# Patient Record
Sex: Female | Born: 1935 | Race: Black or African American | Hispanic: No | Smoking: Never smoker
Health system: Southern US, Community
[De-identification: ages and names within clinical notes are randomized; demographics above are authoritative.]

## PROBLEM LIST (undated history)

## (undated) DIAGNOSIS — K219 Gastro-esophageal reflux disease without esophagitis: Secondary | ICD-10-CM

## (undated) DIAGNOSIS — I639 Cerebral infarction, unspecified: Secondary | ICD-10-CM

## (undated) DIAGNOSIS — E785 Hyperlipidemia, unspecified: Secondary | ICD-10-CM

## (undated) DIAGNOSIS — G309 Alzheimer's disease, unspecified: Secondary | ICD-10-CM

## (undated) DIAGNOSIS — N19 Unspecified kidney failure: Secondary | ICD-10-CM

## (undated) DIAGNOSIS — F0280 Dementia in other diseases classified elsewhere without behavioral disturbance: Secondary | ICD-10-CM

## (undated) DIAGNOSIS — F32A Depression, unspecified: Secondary | ICD-10-CM

## (undated) DIAGNOSIS — F329 Major depressive disorder, single episode, unspecified: Secondary | ICD-10-CM

## (undated) DIAGNOSIS — F0390 Unspecified dementia without behavioral disturbance: Secondary | ICD-10-CM

## (undated) DIAGNOSIS — I10 Essential (primary) hypertension: Secondary | ICD-10-CM

## (undated) HISTORY — PX: LAMINECTOMY: SHX219

## (undated) HISTORY — DX: Unspecified kidney failure: N19

## (undated) HISTORY — DX: Essential (primary) hypertension: I10

## (undated) HISTORY — DX: Hyperlipidemia, unspecified: E78.5

## (undated) HISTORY — DX: Major depressive disorder, single episode, unspecified: F32.9

## (undated) HISTORY — PX: CATARACT EXTRACTION W/ INTRAOCULAR LENS IMPLANT: SHX1309

## (undated) HISTORY — PX: CHOLECYSTECTOMY: SHX55

## (undated) HISTORY — DX: Gastro-esophageal reflux disease without esophagitis: K21.9

## (undated) HISTORY — PX: TOTAL ABDOMINAL HYSTERECTOMY: SHX209

## (undated) HISTORY — DX: Unspecified dementia, unspecified severity, without behavioral disturbance, psychotic disturbance, mood disturbance, and anxiety: F03.90

## (undated) HISTORY — DX: Cerebral infarction, unspecified: I63.9

## (undated) HISTORY — DX: Depression, unspecified: F32.A

---

## 2010-07-04 ENCOUNTER — Encounter: Payer: Self-pay | Admitting: Family Medicine

## 2010-07-17 ENCOUNTER — Encounter: Payer: Self-pay | Admitting: Family Medicine

## 2010-08-15 ENCOUNTER — Encounter: Payer: Self-pay | Admitting: Family Medicine

## 2010-08-20 ENCOUNTER — Inpatient Hospital Stay: Payer: Self-pay | Admitting: Specialist

## 2010-09-19 ENCOUNTER — Inpatient Hospital Stay: Payer: Self-pay | Admitting: Internal Medicine

## 2010-12-03 ENCOUNTER — Ambulatory Visit
Admission: RE | Admit: 2010-12-03 | Discharge: 2010-12-03 | Disposition: A | Discharge status: Home / Self Care | Payer: Medicare Other | Source: Ambulatory Visit | Attending: Internal Medicine | Admitting: Internal Medicine

## 2010-12-03 ENCOUNTER — Other Ambulatory Visit: Payer: Self-pay | Admitting: Internal Medicine

## 2010-12-03 DIAGNOSIS — R062 Wheezing: Secondary | ICD-10-CM

## 2010-12-03 DIAGNOSIS — R140 Abdominal distension (gaseous): Secondary | ICD-10-CM

## 2011-01-28 ENCOUNTER — Other Ambulatory Visit: Payer: Self-pay | Admitting: Internal Medicine

## 2011-01-28 DIAGNOSIS — I739 Peripheral vascular disease, unspecified: Secondary | ICD-10-CM

## 2011-02-04 ENCOUNTER — Encounter: Payer: Self-pay | Admitting: Internal Medicine

## 2011-02-04 ENCOUNTER — Ambulatory Visit (INDEPENDENT_AMBULATORY_CARE_PROVIDER_SITE_OTHER): Payer: Medicare Other | Admitting: Internal Medicine

## 2011-02-04 VITALS — BP 132/76 | HR 63 | Ht 66.0 in | Wt 177.4 lb

## 2011-02-04 DIAGNOSIS — J45909 Unspecified asthma, uncomplicated: Secondary | ICD-10-CM

## 2011-02-04 MED ORDER — ALBUTEROL SULFATE (2.5 MG/3ML) 0.083% IN NEBU: 2.5000 mg | INHALATION_SOLUTION | Freq: Four times a day (QID) | RESPIRATORY_TRACT | Status: DC | PRN

## 2011-02-04 MED ORDER — ALBUTEROL SULFATE (2.5 MG/3ML) 0.083% IN NEBU
2.5000 mg | INHALATION_SOLUTION | Freq: Once | RESPIRATORY_TRACT | Status: AC
  Administered 2011-02-04: 2.5 mg via RESPIRATORY_TRACT

## 2011-02-04 NOTE — Assessment & Plan Note (Addendum)
Wheeze suggests that there may a reversible asthma component. She is unable to coordinate metered inhalers, but may do well with a nebulizer for trial. We have discussed postnasal drip and reflux to watch for. I have encouraged elevation of the head of the bed. I don't believe she could perform a pulmonary function test reliably although we may try simple spirometry later. BP is high and there may be interstitial edema which would justify addition of a diuretic back into her BP management depending on the stability of her renal function.Marland Kitchen

## 2011-02-04 NOTE — Progress Notes (Signed)
Subjective:    Patient ID: Angela Cervantes, female    DOB: 1921-11-28, 77 y.o.   MRN: [id number]  HPI 02/04/11- 77 year old female never smoker referred courtesy of Dr. Allena Katz because of wheezing dyspnea over the past 6 months. She comes with her son Angela Cervantes and his wife. Her son actually does much of the explaining and history. They describe wheeze. It seems to wake her at times, but also occurs during the daytime with poorly defined pattern. She is worse with exertion and relieved by rest. She does not notice reflux. They deny that she chokes while eating or drinking. They deny history of heart disease or heart failure but say she was treated for "fluid" in March and April. A diuretic helped that problem but may not have affected the current complaint. Metered inhalers did not seem to work because she could not use them reliably. The family indicates active medical problems of hypertension, depression, dementia with history of stroke, GERD, allergic rhinitis. Chest x-ray 09/19/2010 showed no active disease. Chest x-ray 11/07/2010 indicated right lower lobe infiltrate. Chest x-ray 11/23/2010 indicated moderate chronic bronchitis with linear density in the right upper lobe.  Review of Systems Constitutional:   No-   weight loss, night sweats, fevers, chills, fatigue, lassitude. HEENT:   No-  headaches, difficulty swallowing, tooth/dental problems, sore throat,       No-  sneezing, itching, ear ache, nasal congestion, post nasal drip,  CV:  No-   chest pain, orthopnea, PND, swelling in lower extremities, anasarca, dizziness, palpitations Resp: +   shortness of breath with exertion, not at rest.              No-   productive cough,  No non-productive cough,  No-  coughing up of blood.              No-   change in color of mucus.  + wheezing.   Skin: No-   rash or lesions. GI:  No-   heartburn, indigestion, abdominal pain, nausea, vomiting, diarrhea,                 change in bowel habits, loss of  appetite GU: No-   dysuria, change in color of urine, no urgency or frequency.  No- flank pain. MS:  No-   joint pain or swelling.  No- decreased range of motion.  No- back pain. Neuro- grossly normal to observation, Or:  Psych:  No- change in mood or affect. No depression or anxiety. +memory loss.      Objective:   Physical Exam General- Alert, Oriented, Affect-appropriate, Distress- none acute, passive but responsive to questions, wheelchair Skin- rash-none, lesions- none, excoriation- none Lymphadenopathy- none Head- atraumatic            Eyes- Gross vision intact, PERRLA, conjunctivae clear secretions            Ears- Hearing-seems intact            Nose- Clear, no-Septal dev, mucus, polyps, erosion, perforation             Throat- Mallampati III-IV , mucosa clear- not red , drainage- none, tonsils- atrophic Neck- flexible , trachea midline, no stridor , thyroid nl, carotid no bruit Chest - symmetrical excursion , unlabored           Heart/CV- RRR , no murmur , no gallop  , no rub, nl s1 s2                           -  JVD- none , edema- 1+ with TED hose, stasis changes- none, varices- none           Lung- slight upper airway rattle, wheeze- none, cough- none , dullness-none, rub- none           Chest wall-  Abd- tender-no, distended-no, bowel sounds-present, HSM- no Br/ Gen/ Rectal- Not done, not indicated Extrem- cyanosis- none, clubbing, none, atrophy- none, strength- nl Neuro- grossly intact to observation         Assessment & Plan:

## 2011-02-04 NOTE — Patient Instructions (Addendum)
Try elevating the head of her bed about the height of a brick under each head leg.  Script for [facility]- DME  Nebulizer and albuterol  Nebulizer albuterol here

## 2011-02-05 ENCOUNTER — Ambulatory Visit
Admission: RE | Admit: 2011-02-05 | Discharge: 2011-02-05 | Disposition: A | Discharge status: Home / Self Care | Payer: Medicare Other | Source: Ambulatory Visit | Attending: Internal Medicine | Admitting: Internal Medicine

## 2011-02-05 DIAGNOSIS — I739 Peripheral vascular disease, unspecified: Secondary | ICD-10-CM

## 2011-03-25 ENCOUNTER — Ambulatory Visit: Payer: Medicare Other | Admitting: Internal Medicine

## 2011-03-28 ENCOUNTER — Inpatient Hospital Stay: Payer: Self-pay | Admitting: Internal Medicine

## 2011-05-29 ENCOUNTER — Ambulatory Visit: Payer: Self-pay | Admitting: Otolaryngology

## 2011-07-16 ENCOUNTER — Other Ambulatory Visit (HOSPITAL_COMMUNITY): Payer: Self-pay | Admitting: *Deleted

## 2011-07-17 LAB — COMPREHENSIVE METABOLIC PANEL
Alkaline Phosphatase: 96 U/L (ref 50–136)
BUN: 10 mg/dL (ref 7–18)
Calcium, Total: 9.5 mg/dL (ref 8.5–10.1)
Chloride: 88 mmol/L — ABNORMAL LOW (ref 98–107)
EGFR (African American): >60
EGFR (Non-African Amer.): >60
Potassium: 4.2 mmol/L (ref 3.5–5.1)
SGOT(AST): 37 U/L (ref 15–37)
SGPT (ALT): 32 U/L
Total Protein: 8.2 g/dL (ref 6.4–8.2)

## 2011-07-17 LAB — PROTIME-INR
INR: 0.9
Prothrombin Time: 12.9 secs (ref 11.5–14.7)

## 2011-07-17 LAB — CBC
HGB: 12.4 g/dL (ref 12.0–16.0)
MCH: 27 pg (ref 26.0–34.0)
MCHC: 32.7 g/dL (ref 32.0–36.0)
MCV: 83 fL (ref 80–100)
Platelet: 219 10*3/uL (ref 150–440)
RDW: 12.8 % (ref 11.5–14.5)
WBC: 5.8 10*3/uL (ref 3.6–11.0)

## 2011-07-17 LAB — URINALYSIS, COMPLETE
Glucose,UR: NEGATIVE mg/dL (ref 0–75)
Nitrite: NEGATIVE
Protein: NEGATIVE
RBC,UR: 2 /HPF (ref 0–5)
Squamous Epithelial: NONE SEEN
WBC UR: 58 /HPF (ref 0–5)

## 2011-07-18 ENCOUNTER — Inpatient Hospital Stay: Payer: Self-pay | Admitting: Internal Medicine

## 2011-07-18 LAB — SODIUM
Sodium: 128 mmol/L — ABNORMAL LOW (ref 136–145)
Sodium: 128 mmol/L — ABNORMAL LOW (ref 136–145)

## 2011-07-18 LAB — CK TOTAL AND CKMB (NOT AT ARMC)
CK, Total: 142 U/L (ref 21–215)
CK, Total: 116 U/L (ref 21–215)
CK, Total: 105 U/L (ref 21–215)
CK-MB: 0.8 ng/mL (ref 0.5–3.6)

## 2011-07-19 LAB — BASIC METABOLIC PANEL
Anion Gap: 13 (ref 7–16)
BUN: 13 mg/dL (ref 7–18)
Calcium, Total: 9 mg/dL (ref 8.5–10.1)
Chloride: 92 mmol/L — ABNORMAL LOW (ref 98–107)
Creatinine: 1.06 mg/dL (ref 0.60–1.30)
EGFR (African American): >60
EGFR (Non-African Amer.): 52 — ABNORMAL LOW
Osmolality: 258 (ref 275–301)

## 2011-07-19 LAB — CBC WITH DIFFERENTIAL/PLATELET
Basophil #: 0 10*3/uL (ref 0.0–0.1)
Basophil %: 0.5 %
Eosinophil #: 0.1 10*3/uL (ref 0.0–0.7)
Lymphocyte #: 1.3 10*3/uL (ref 1.0–3.6)
Lymphocyte %: 20.8 %
MCHC: 33.1 g/dL (ref 32.0–36.0)
MCV: 83 fL (ref 80–100)
Monocyte #: 0.7 10*3/uL (ref 0.0–0.7)
Neutrophil #: 4.2 10*3/uL (ref 1.4–6.5)
Neutrophil %: 65.6 %
RDW: 13.1 % (ref 11.5–14.5)

## 2011-07-20 LAB — BASIC METABOLIC PANEL
Anion Gap: 11 (ref 7–16)
BUN: 16 mg/dL (ref 7–18)
Calcium, Total: 9 mg/dL (ref 8.5–10.1)
Chloride: 92 mmol/L — ABNORMAL LOW (ref 98–107)
Co2: 24 mmol/L (ref 21–32)
EGFR (Non-African Amer.): 52 — ABNORMAL LOW
Glucose: 99 mg/dL (ref 65–99)
Osmolality: 256 (ref 275–301)

## 2011-07-20 LAB — SODIUM, URINE, RANDOM: Sodium, Urine Random: 33 mmol/L (ref 20–110)

## 2011-07-20 LAB — URINE CULTURE

## 2011-07-21 LAB — BASIC METABOLIC PANEL
Anion Gap: 14 (ref 7–16)
BUN: 15 mg/dL (ref 7–18)
Calcium, Total: 8.5 mg/dL (ref 8.5–10.1)
Chloride: 100 mmol/L (ref 98–107)
Co2: 21 mmol/L (ref 21–32)
Creatinine: 0.91 mg/dL (ref 0.60–1.30)
EGFR (Non-African Amer.): >60
Potassium: 3.7 mmol/L (ref 3.5–5.1)
Sodium: 135 mmol/L — ABNORMAL LOW (ref 136–145)

## 2011-07-22 LAB — BASIC METABOLIC PANEL
Anion Gap: 14 (ref 7–16)
BUN: 12 mg/dL (ref 7–18)
EGFR (Non-African Amer.): >60
Glucose: 97 mg/dL (ref 65–99)
Osmolality: 270 (ref 275–301)
Sodium: 135 mmol/L — ABNORMAL LOW (ref 136–145)

## 2011-07-22 LAB — CBC WITH DIFFERENTIAL/PLATELET
Basophil #: 0 10*3/uL (ref 0.0–0.1)
Basophil %: 0.5 %
Eosinophil #: 0.3 10*3/uL (ref 0.0–0.7)
HCT: 33.6 % — ABNORMAL LOW (ref 35.0–47.0)
HGB: 10.8 g/dL — ABNORMAL LOW (ref 12.0–16.0)
MCH: 26.9 pg (ref 26.0–34.0)
MCHC: 32.3 g/dL (ref 32.0–36.0)
MCV: 84 fL (ref 80–100)
Monocyte #: 0.8 10*3/uL — ABNORMAL HIGH (ref 0.0–0.7)
Monocyte %: 13.2 %
Neutrophil #: 3.5 10*3/uL (ref 1.4–6.5)
Neutrophil %: 57 %
Platelet: 209 10*3/uL (ref 150–440)

## 2011-07-23 ENCOUNTER — Inpatient Hospital Stay (HOSPITAL_COMMUNITY): Admission: RE | Admit: 2011-07-23 | Payer: Medicare Other | Source: Ambulatory Visit

## 2011-07-23 ENCOUNTER — Ambulatory Visit (HOSPITAL_COMMUNITY): Payer: Medicare Other

## 2011-08-15 ENCOUNTER — Ambulatory Visit: Payer: Self-pay | Admitting: Internal Medicine

## 2011-08-17 ENCOUNTER — Other Ambulatory Visit: Payer: Self-pay

## 2011-08-17 LAB — BASIC METABOLIC PANEL
Anion Gap: 13 (ref 7–16)
Calcium, Total: 8.6 mg/dL (ref 8.5–10.1)
Chloride: 103 mmol/L (ref 98–107)
Co2: 23 mmol/L (ref 21–32)
Creatinine: 1.2 mg/dL (ref 0.60–1.30)
EGFR (African American): 55 — ABNORMAL LOW
Glucose: 143 mg/dL — ABNORMAL HIGH (ref 65–99)
Osmolality: 283 (ref 275–301)
Sodium: 139 mmol/L (ref 136–145)

## 2011-08-17 LAB — CBC WITH DIFFERENTIAL/PLATELET
Bands: 2 %
Basophil #: 0 10*3/uL (ref 0.0–0.1)
Basophil: 1 %
Comment - H1-Com1: NORMAL
Comment - H1-Com2: NORMAL
Eosinophil #: 0.1 10*3/uL (ref 0.0–0.7)
HCT: 32.1 % — ABNORMAL LOW (ref 35.0–47.0)
HGB: 10.6 g/dL — ABNORMAL LOW (ref 12.0–16.0)
Lymphocyte #: 1.2 10*3/uL (ref 1.0–3.6)
Lymphocyte %: 28.2 %
Lymphocytes: 28 %
MCH: 27.1 pg (ref 26.0–34.0)
MCHC: 32.9 g/dL (ref 32.0–36.0)
MCV: 82 fL (ref 80–100)
Monocyte #: 1.1 10*3/uL — ABNORMAL HIGH (ref 0.0–0.7)
Monocyte %: 25.7 %
Monocytes: 22 %
Neutrophil #: 1.8 10*3/uL (ref 1.4–6.5)
Platelet: 199 10*3/uL (ref 150–440)
RDW: 13.3 % (ref 11.5–14.5)
WBC: 4.4 10*3/uL (ref 3.6–11.0)

## 2011-08-17 LAB — URINALYSIS, COMPLETE
Bacteria: NONE SEEN
Bilirubin,UR: NEGATIVE
Blood: NEGATIVE
Ketone: NEGATIVE
Leukocyte Esterase: NEGATIVE
Ph: 7 (ref 4.5–8.0)
Protein: NEGATIVE
RBC,UR: <1 /HPF (ref 0–5)
Specific Gravity: 1.014 (ref 1.003–1.030)
Squamous Epithelial: NONE SEEN
WBC UR: <1 /HPF (ref 0–5)

## 2011-08-24 ENCOUNTER — Inpatient Hospital Stay: Payer: Self-pay | Admitting: Internal Medicine

## 2011-08-24 LAB — COMPREHENSIVE METABOLIC PANEL
Alkaline Phosphatase: 95 U/L (ref 50–136)
Bilirubin,Total: 0.4 mg/dL (ref 0.2–1.0)
Calcium, Total: 10 mg/dL (ref 8.5–10.1)
Chloride: 117 mmol/L — ABNORMAL HIGH (ref 98–107)
Co2: 20 mmol/L — ABNORMAL LOW (ref 21–32)
Creatinine: 1.71 mg/dL — ABNORMAL HIGH (ref 0.60–1.30)
EGFR (African American): 36 — ABNORMAL LOW
EGFR (Non-African Amer.): 30 — ABNORMAL LOW
SGOT(AST): 37 U/L (ref 15–37)
SGPT (ALT): 42 U/L
Sodium: 152 mmol/L — ABNORMAL HIGH (ref 136–145)

## 2011-08-24 LAB — APTT: Activated PTT: 58.1 secs — ABNORMAL HIGH (ref 23.6–35.9)

## 2011-08-24 LAB — URINALYSIS, COMPLETE
Bacteria: NONE SEEN
Leukocyte Esterase: NEGATIVE
Nitrite: NEGATIVE
Ph: 5 (ref 4.5–8.0)
Protein: 30
RBC,UR: <1 /HPF (ref 0–5)
Specific Gravity: 1.014 (ref 1.003–1.030)

## 2011-08-24 LAB — CBC
HGB: 12.3 g/dL (ref 12.0–16.0)
MCH: 26.9 pg (ref 26.0–34.0)
MCHC: 32.5 g/dL (ref 32.0–36.0)
MCV: 83 fL (ref 80–100)
RBC: 4.57 10*6/uL (ref 3.80–5.20)

## 2011-08-24 LAB — CK TOTAL AND CKMB (NOT AT ARMC): CK, Total: 253 U/L — ABNORMAL HIGH (ref 21–215)

## 2011-08-25 DIAGNOSIS — I517 Cardiomegaly: Secondary | ICD-10-CM

## 2011-08-25 LAB — COMPREHENSIVE METABOLIC PANEL
Albumin: 3.6 g/dL (ref 3.4–5.0)
Alkaline Phosphatase: 95 U/L (ref 50–136)
Anion Gap: 17 — ABNORMAL HIGH (ref 7–16)
BUN: 77 mg/dL — ABNORMAL HIGH (ref 7–18)
Bilirubin,Total: 0.4 mg/dL (ref 0.2–1.0)
Calcium, Total: 10 mg/dL (ref 8.5–10.1)
EGFR (Non-African Amer.): 37 — ABNORMAL LOW
Glucose: 97 mg/dL (ref 65–99)
Osmolality: 326 (ref 275–301)
SGOT(AST): 50 U/L — ABNORMAL HIGH (ref 15–37)
SGPT (ALT): 45 U/L
Total Protein: 8.8 g/dL — ABNORMAL HIGH (ref 6.4–8.2)

## 2011-08-25 LAB — CBC WITH DIFFERENTIAL/PLATELET
Basophil %: 0 %
Eosinophil %: 0.1 %
HCT: 40.5 % (ref 35.0–47.0)
Lymphocyte %: 12.4 %
Monocyte #: 1.6 10*3/uL — ABNORMAL HIGH (ref 0.0–0.7)
Monocyte %: 10.1 %
Neutrophil %: 77.4 %
Platelet: 201 10*3/uL (ref 150–440)
RBC: 4.84 10*6/uL (ref 3.80–5.20)
WBC: 15.8 10*3/uL — ABNORMAL HIGH (ref 3.6–11.0)

## 2011-08-25 LAB — TROPONIN I: Troponin-I: 0.05 ng/mL

## 2011-08-25 LAB — CK TOTAL AND CKMB (NOT AT ARMC): CK, Total: 204 U/L (ref 21–215)

## 2011-08-26 LAB — COMPREHENSIVE METABOLIC PANEL
Albumin: 2.6 g/dL — ABNORMAL LOW (ref 3.4–5.0)
Alkaline Phosphatase: 68 U/L (ref 50–136)
Anion Gap: 16 (ref 7–16)
BUN: 67 mg/dL — ABNORMAL HIGH (ref 7–18)
Calcium, Total: 8.7 mg/dL (ref 8.5–10.1)
Co2: 17 mmol/L — ABNORMAL LOW (ref 21–32)
Creatinine: 1.79 mg/dL — ABNORMAL HIGH (ref 0.60–1.30)
Osmolality: 308 (ref 275–301)
Potassium: 3.5 mmol/L (ref 3.5–5.1)
SGOT(AST): 54 U/L — ABNORMAL HIGH (ref 15–37)
SGPT (ALT): 49 U/L
Sodium: 144 mmol/L (ref 136–145)
Total Protein: 7.2 g/dL (ref 6.4–8.2)

## 2011-08-26 LAB — URINE CULTURE

## 2011-08-26 LAB — WBC: WBC: 13.6 10*3/uL — ABNORMAL HIGH (ref 3.6–11.0)

## 2011-08-30 LAB — CULTURE, BLOOD (SINGLE)

## 2011-09-15 ENCOUNTER — Ambulatory Visit: Payer: Self-pay | Admitting: Internal Medicine

## 2011-09-17 ENCOUNTER — Encounter: Payer: Medicare Other | Admitting: Cardiovascular Disease

## 2012-10-22 ENCOUNTER — Emergency Department (HOSPITAL_COMMUNITY): Payer: Medicare Other

## 2012-10-22 ENCOUNTER — Other Ambulatory Visit: Payer: Self-pay

## 2012-10-22 ENCOUNTER — Inpatient Hospital Stay (HOSPITAL_COMMUNITY)
Admission: EM | Admit: 2012-10-22 | Discharge: 2012-10-24 | DRG: 682 | Disposition: A | Discharge status: Skilled Nursing Facility | Payer: Medicare Other | Attending: Internal Medicine | Admitting: Internal Medicine

## 2012-10-22 ENCOUNTER — Encounter (HOSPITAL_COMMUNITY): Payer: Self-pay | Admitting: Emergency Medicine

## 2012-10-22 DIAGNOSIS — M109 Gout, unspecified: Secondary | ICD-10-CM | POA: Diagnosis present

## 2012-10-22 DIAGNOSIS — I10 Essential (primary) hypertension: Secondary | ICD-10-CM | POA: Diagnosis present

## 2012-10-22 DIAGNOSIS — K219 Gastro-esophageal reflux disease without esophagitis: Secondary | ICD-10-CM | POA: Diagnosis present

## 2012-10-22 DIAGNOSIS — L408 Other psoriasis: Secondary | ICD-10-CM | POA: Diagnosis present

## 2012-10-22 DIAGNOSIS — E872 Acidosis, unspecified: Secondary | ICD-10-CM | POA: Diagnosis present

## 2012-10-22 DIAGNOSIS — E875 Hyperkalemia: Secondary | ICD-10-CM | POA: Diagnosis present

## 2012-10-22 DIAGNOSIS — I129 Hypertensive chronic kidney disease with stage 1 through stage 4 chronic kidney disease, or unspecified chronic kidney disease: Secondary | ICD-10-CM | POA: Diagnosis present

## 2012-10-22 DIAGNOSIS — N318 Other neuromuscular dysfunction of bladder: Secondary | ICD-10-CM | POA: Diagnosis present

## 2012-10-22 DIAGNOSIS — E1169 Type 2 diabetes mellitus with other specified complication: Secondary | ICD-10-CM | POA: Diagnosis present

## 2012-10-22 DIAGNOSIS — E860 Dehydration: Secondary | ICD-10-CM

## 2012-10-22 DIAGNOSIS — N179 Acute kidney failure, unspecified: Principal | ICD-10-CM | POA: Diagnosis present

## 2012-10-22 DIAGNOSIS — Z66 Do not resuscitate: Secondary | ICD-10-CM | POA: Diagnosis present

## 2012-10-22 DIAGNOSIS — N3281 Overactive bladder: Secondary | ICD-10-CM | POA: Diagnosis present

## 2012-10-22 DIAGNOSIS — G9341 Metabolic encephalopathy: Secondary | ICD-10-CM | POA: Diagnosis present

## 2012-10-22 DIAGNOSIS — Z794 Long term (current) use of insulin: Secondary | ICD-10-CM

## 2012-10-22 DIAGNOSIS — N183 Chronic kidney disease, stage 3 unspecified: Secondary | ICD-10-CM | POA: Diagnosis present

## 2012-10-22 DIAGNOSIS — D649 Anemia, unspecified: Secondary | ICD-10-CM | POA: Diagnosis present

## 2012-10-22 DIAGNOSIS — N19 Unspecified kidney failure: Secondary | ICD-10-CM

## 2012-10-22 DIAGNOSIS — E119 Type 2 diabetes mellitus without complications: Secondary | ICD-10-CM | POA: Diagnosis present

## 2012-10-22 DIAGNOSIS — L409 Psoriasis, unspecified: Secondary | ICD-10-CM | POA: Diagnosis present

## 2012-10-22 LAB — CBC WITH DIFFERENTIAL/PLATELET
Basophils Absolute: 0 10*3/uL (ref 0.0–0.1)
Basophils Relative: 0 % (ref 0–1)
Eosinophils Absolute: 0.4 10*3/uL (ref 0.0–0.7)
Eosinophils Relative: 5 % (ref 0–5)
HCT: 35.5 % — ABNORMAL LOW (ref 36.0–46.0)
Hemoglobin: 11.2 g/dL — ABNORMAL LOW (ref 12.0–15.0)
Lymphocytes Relative: 34 % (ref 12–46)
Lymphs Abs: 2.3 K/uL (ref 0.7–4.0)
MCH: 26.8 pg (ref 26.0–34.0)
MCHC: 31.5 g/dL (ref 30.0–36.0)
MCV: 84.9 fL (ref 78.0–100.0)
Monocytes Absolute: 0.6 10*3/uL (ref 0.1–1.0)
Monocytes Relative: 9 % (ref 3–12)
Neutro Abs: 3.5 10*3/uL (ref 1.7–7.7)
Neutrophils Relative %: 51 % (ref 43–77)
Platelets: 170 K/uL (ref 150–400)
RBC: 4.18 MIL/uL (ref 3.87–5.11)
RDW: 13.8 % (ref 11.5–15.5)
WBC: 6.8 K/uL (ref 4.0–10.5)

## 2012-10-22 LAB — URINALYSIS, ROUTINE W REFLEX MICROSCOPIC
Bilirubin Urine: NEGATIVE
Glucose, UA: NEGATIVE mg/dL
Hgb urine dipstick: NEGATIVE
Ketones, ur: NEGATIVE mg/dL
Leukocytes, UA: NEGATIVE
Nitrite: NEGATIVE
Protein, ur: NEGATIVE mg/dL
Specific Gravity, Urine: 1.013 (ref 1.005–1.030)
Urobilinogen, UA: 0.2 mg/dL (ref 0.0–1.0)
pH: 6 (ref 5.0–8.0)

## 2012-10-22 LAB — COMPREHENSIVE METABOLIC PANEL
AST: 23 U/L (ref 0–37)
Albumin: 3.6 g/dL (ref 3.5–5.2)
BUN: 85 mg/dL — ABNORMAL HIGH (ref 6–23)
Calcium: 10 mg/dL (ref 8.4–10.5)
Creatinine, Ser: 3.07 mg/dL — ABNORMAL HIGH (ref 0.50–1.10)
Total Bilirubin: 0.1 mg/dL — ABNORMAL LOW (ref 0.3–1.2)
Total Protein: 8.3 g/dL (ref 6.0–8.3)

## 2012-10-22 LAB — COMPREHENSIVE METABOLIC PANEL WITH GFR
ALT: 19 U/L (ref 0–35)
Alkaline Phosphatase: 109 U/L (ref 39–117)
CO2: 14 meq/L — ABNORMAL LOW (ref 19–32)
Chloride: 112 meq/L (ref 96–112)
GFR calc Af Amer: 15 mL/min — ABNORMAL LOW (ref >90)
GFR calc non Af Amer: 13 mL/min — ABNORMAL LOW (ref >90)
Glucose, Bld: 89 mg/dL (ref 70–99)
Potassium: 6.1 meq/L — ABNORMAL HIGH (ref 3.5–5.1)
Sodium: 139 meq/L (ref 135–145)

## 2012-10-22 LAB — PROTIME-INR
INR: 1.18 (ref 0.00–1.49)
Prothrombin Time: 14.8 seconds (ref 11.6–15.2)

## 2012-10-22 LAB — LACTIC ACID, PLASMA: Lactic Acid, Venous: 1.2 mmol/L (ref 0.5–2.2)

## 2012-10-22 LAB — GLUCOSE, CAPILLARY: Glucose-Capillary: 112 mg/dL — ABNORMAL HIGH (ref 70–99)

## 2012-10-22 LAB — APTT: aPTT: 50 seconds — ABNORMAL HIGH (ref 24–37)

## 2012-10-22 MED ORDER — CLONIDINE HCL 0.2 MG/24HR TD PTWK: 0.2000 mg | MEDICATED_PATCH | TRANSDERMAL | Status: DC

## 2012-10-22 MED ORDER — INSULIN ASPART 100 UNIT/ML IV SOLN
5.0000 [IU] | Freq: Once | INTRAVENOUS | Status: AC
  Administered 2012-10-22: 5 [IU] via INTRAVENOUS
  Filled 2012-10-22: qty 0.05

## 2012-10-22 MED ORDER — DEXTROSE 50 % IV SOLN
50.0000 mL | Freq: Once | INTRAVENOUS | Status: AC
  Administered 2012-10-22: 50 mL via INTRAVENOUS
  Filled 2012-10-22: qty 50

## 2012-10-22 MED ORDER — IPRATROPIUM-ALBUTEROL 0.5-2.5 (3) MG/3ML IN SOLN: 3.0000 mL | Freq: Four times a day (QID) | RESPIRATORY_TRACT | Status: DC | PRN

## 2012-10-22 MED ORDER — ZINC OXIDE 12.8 % EX OINT: TOPICAL_OINTMENT | CUTANEOUS | Status: DC | PRN

## 2012-10-22 MED ORDER — ZINC OXIDE 20 % EX OINT
TOPICAL_OINTMENT | CUTANEOUS | Status: DC | PRN
  Filled 2012-10-22: qty 28.35

## 2012-10-22 MED ORDER — ONDANSETRON HCL 4 MG PO TABS: 4.0000 mg | ORAL_TABLET | Freq: Four times a day (QID) | ORAL | Status: DC | PRN

## 2012-10-22 MED ORDER — SODIUM CHLORIDE 0.9 % IV BOLUS (SEPSIS)
500.0000 mL | Freq: Once | INTRAVENOUS | Status: AC
  Administered 2012-10-22: 500 mL via INTRAVENOUS

## 2012-10-22 MED ORDER — SODIUM BICARBONATE 8.4 % IV SOLN
50.0000 meq | Freq: Once | INTRAVENOUS | Status: AC
  Administered 2012-10-22: 50 meq via INTRAVENOUS
  Filled 2012-10-22: qty 50

## 2012-10-22 MED ORDER — IPRATROPIUM BROMIDE 0.02 % IN SOLN: 0.5000 mg | Freq: Four times a day (QID) | RESPIRATORY_TRACT | Status: DC | PRN

## 2012-10-22 MED ORDER — SODIUM CHLORIDE 0.9 % IV SOLN: INTRAVENOUS | Status: DC

## 2012-10-22 MED ORDER — SODIUM CHLORIDE 0.9 % IV SOLN
1.0000 g | Freq: Once | INTRAVENOUS | Status: AC
  Administered 2012-10-22: 1 g via INTRAVENOUS
  Filled 2012-10-22: qty 10

## 2012-10-22 MED ORDER — HALOPERIDOL LACTATE 5 MG/ML IJ SOLN
2.0000 mg | Freq: Once | INTRAMUSCULAR | Status: AC
  Administered 2012-10-22: 2 mg via INTRAVENOUS
  Filled 2012-10-22: qty 1

## 2012-10-22 MED ORDER — ENOXAPARIN SODIUM 30 MG/0.3ML ~~LOC~~ SOLN
30.0000 mg | SUBCUTANEOUS | Status: DC
  Administered 2012-10-22 – 2012-10-23 (×2): 30 mg via SUBCUTANEOUS
  Filled 2012-10-22 (×3): qty 0.3

## 2012-10-22 MED ORDER — ONDANSETRON HCL 4 MG/2ML IJ SOLN: 4.0000 mg | Freq: Four times a day (QID) | INTRAMUSCULAR | Status: DC | PRN

## 2012-10-22 MED ORDER — INSULIN ASPART 100 UNIT/ML ~~LOC~~ SOLN: 0.0000 [IU] | SUBCUTANEOUS | Status: DC

## 2012-10-22 MED ORDER — ACETAMINOPHEN 650 MG RE SUPP: 650.0000 mg | Freq: Four times a day (QID) | RECTAL | Status: DC | PRN

## 2012-10-22 MED ORDER — TRIAMCINOLONE ACETONIDE 0.5 % EX CREA
1.0000 "application " | TOPICAL_CREAM | Freq: Three times a day (TID) | CUTANEOUS | Status: DC
  Administered 2012-10-22 – 2012-10-24 (×5): 1 via TOPICAL
  Filled 2012-10-22: qty 15

## 2012-10-22 MED ORDER — ACETAMINOPHEN 325 MG PO TABS: 650.0000 mg | ORAL_TABLET | Freq: Four times a day (QID) | ORAL | Status: DC | PRN

## 2012-10-22 MED ORDER — SODIUM BICARBONATE 8.4 % IV SOLN
INTRAVENOUS | Status: DC
  Administered 2012-10-22: 20:00:00 via INTRAVENOUS
  Filled 2012-10-22 (×3): qty 1000

## 2012-10-22 MED ORDER — ALBUTEROL SULFATE (5 MG/ML) 0.5% IN NEBU: 2.5000 mg | INHALATION_SOLUTION | Freq: Four times a day (QID) | RESPIRATORY_TRACT | Status: DC | PRN

## 2012-10-22 NOTE — ED Provider Notes (Signed)
Date: 10/22/2012  Rate: 72  Rhythm: normal sinus rhythm  QRS Axis: normal  Intervals: normal  ST/T Wave abnormalities: normal  Conduction Disutrbances:none  Narrative Interpretation:   Old EKG Reviewed: none available  Unknown baseline Cr. Elevated today with hyperkalemia. No concerning EKG findings. Will discuss with [facility].   Dr Darnelle Catalan will admit.  Loren Racer, MD 10/22/12 1710

## 2012-10-22 NOTE — ED Notes (Signed)
Called to give report, receiving rn reports that she will call back in a couple of mins.

## 2012-10-22 NOTE — ED Provider Notes (Signed)
History     CSN: [id number]  Arrival date & time 10/22/12  1314   First MD Initiated Contact with Patient 10/22/12 1413      Chief Complaint  Patient presents with  . Weakness  . Altered Mental Status    (Consider location/radiation/quality/duration/timing/severity/associated sxs/prior treatment) HPI Comments: Level V caveat due to to dementia and altered mental status. Patient currently lives in a assisted living facility, memory unit and is brought here at request of family do to increasing lethargy, decrease in appetite, not eating and drinking. He reports less active and not willing to eat or drink that has been gradually worsening over the past week. No reports of nausea vomiting, diarrhea. No reports of headaches, sore throat, chest pain, cough, abdominal pain. She has indicated to family that she's had some dental pain in the right upper side, and there is an outpatient appointment with a dentist in the near future. They report the patient does have a living will and son has power of attorney and requests no aggressive measures to be initiated including intubation, chest compressions, defibrillation, IV pressors. Patient will rales and speak to family and she does rouse and speak to me, is conversant and sensical.  She denies headache, stiff neck, chest pain, abdominal pain or nausea at this time.  Patient is a 79 y.o. female presenting with weakness and altered mental status. The history is provided by the patient and a relative. The history is limited by the condition of the patient.  Weakness  Altered Mental Status    Past Medical History  Diagnosis Date  . Stroke   . Dementia   . HTN (hypertension)   . Hyperlipidemia   . GERD (gastroesophageal reflux disease)   . Gout   . Depression   . Renal failure     Past Surgical History  Procedure Laterality Date  . Cholecystectomy    . Total abdominal hysterectomy      Family History  Problem Relation Age of Onset  .  Hypertension Mother   . Hypertension Father     History  Substance Use Topics  . Smoking status: Never Smoker   . Smokeless tobacco: Not on file  . Alcohol Use: No    OB History   Grav Para Term Preterm Abortions TAB SAB Ect Mult Living                  Review of Systems  Unable to perform ROS: Dementia  Neurological: Positive for weakness.  Psychiatric/Behavioral: Positive for altered mental status.    Allergies  Review of patient's allergies indicates no known allergies.  Home Medications   Current Outpatient Rx  Name  Route  Sig  Dispense  Refill  . acetaminophen (MAPAP) 500 MG tablet   Oral   Take 1,000 mg by mouth every 8 (eight) hours as needed for pain.          Marland Kitchen allopurinol (ZYLOPRIM) 100 MG tablet   Oral   Take 100 mg by mouth every morning.          Marland Kitchen amLODipine (NORVASC) 10 MG tablet   Oral   Take 10 mg by mouth every morning.          Marland Kitchen aspirin 81 MG tablet   Oral   Take 81 mg by mouth every morning.          . cetirizine (ZYRTEC) 10 MG tablet   Oral   Take 10 mg by mouth every morning. She is  taking for 14 days. She started on 10/14/12 and has not completed.         . cloNIDine (CATAPRES - DOSED IN MG/24 HR) 0.2 mg/24hr patch   Transdermal   Place 1 patch onto the skin every Monday.         . Cranberry 200 MG CAPS   Oral   Take 400 mg by mouth every morning.         . docusate (COLACE) 60 MG/15ML syrup   Oral   Take 40 mg by mouth 2 (two) times daily.         . furosemide (LASIX) 20 MG tablet   Oral   Take 20 mg by mouth every morning.         Marland Kitchen guaifenesin (ROBAFEN) 100 MG/5ML syrup   Oral   Take 100 mg by mouth every 4 (four) hours as needed for cough.         . hydrALAZINE (APRESOLINE) 50 MG tablet   Oral   Take 75 mg by mouth every morning.         . insulin aspart (NOVOLOG) 100 UNIT/ML injection   Subcutaneous   Inject 4-12 Units into the skin 3 (three) times daily with meals. She uses a sliding scale:  150-200 = 4 units, 201-250 = 6 units, 251-300 = 8 units, 301-350 = 10 units, 351-400 = 12 units and greater than 400 call MD.         . insulin glargine (LANTUS) 100 UNIT/ML injection   Subcutaneous   Inject 20 Units into the skin at bedtime.         Marland Kitchen ipratropium-albuterol (DUONEB) 0.5-2.5 (3) MG/3ML SOLN   Nebulization   Take 3 mLs by nebulization every 6 (six) hours as needed (For shortness of breath.).         Marland Kitchen lisinopril (PRINIVIL,ZESTRIL) 20 MG tablet   Oral   Take 20 mg by mouth every morning.         Marland Kitchen LORazepam (ATIVAN) 1 MG tablet   Oral   Take 1 mg by mouth 2 (two) times daily.          Marland Kitchen LORazepam (ATIVAN) 1 MG tablet   Oral   Take 1 mg by mouth every 8 (eight) hours as needed (For agitation.).         Marland Kitchen omeprazole (PRILOSEC) 40 MG capsule   Oral   Take 40 mg by mouth every morning.          Marland Kitchen oxybutynin (DITROPAN-XL) 5 MG 24 hr tablet   Oral   Take 5 mg by mouth every morning.         . polyethylene glycol (MIRALAX / GLYCOLAX) packet   Oral   Take 17 g by mouth every morning.         . sulfamethoxazole-trimethoprim (BACTRIM DS) 800-160 MG per tablet   Oral   Take 1 tablet by mouth 2 (two) times daily. She is taking for 14 days. She started on 10/14/12 and has not completed.         . triamcinolone cream (KENALOG) 0.5 %   Topical   Apply 1 application topically 3 (three) times daily. Applies to rash on forearm, upper back and arms in the morning and then to upper back and arms in the evening.         Marland Kitchen ZINC OXIDE EX   Topical   Apply 1 application topically 2 (two) times daily. Applies to skin breakdown on  bottom.           BP 135/45  Pulse 68  Temp(Src) 97.8 F (36.6 C) (Oral)  Resp 20  SpO2 98%  Physical Exam  Nursing note and vitals reviewed. Constitutional: She appears well-developed and well-nourished. No distress.  HENT:  Head: Normocephalic and atraumatic.  Tender with no visible abscess or cavitation or fracture to  right upper premolar  Eyes: Conjunctivae and EOM are normal.  Neck: Neck supple. No Brudzinski's sign and no Kernig's sign noted.  Cardiovascular: Normal rate and intact distal pulses.   Pulmonary/Chest: Effort normal. No respiratory distress. She has no wheezes. She has no rales.  Abdominal: Soft. She exhibits no distension. There is no tenderness. There is no rebound and no guarding.  Neurological: She is alert.  Skin: Skin is warm. No rash noted. She is not diaphoretic.    ED Course  Procedures (including critical care time)  Labs Reviewed  URINE CULTURE  GLUCOSE, CAPILLARY  CBC WITH DIFFERENTIAL  COMPREHENSIVE METABOLIC PANEL  URINALYSIS, ROUTINE W REFLEX MICROSCOPIC   No results found.   Impression: Altered mentation  Room air saturation is 98% I interpret this to be normal   3:50 PM Labs, films still pending.  Pt with no sig change in the ED clinically in past 1 hour.  Pt is signed out to Dr. Ranae Palms for reassessment and follow up of labs, films and disposition discussion with family.  Likely admission for altered mentation.  MDM  Pt with change in baseline mentation, however is not overtly encephalopathic on my exam.  No fever, BP has wide PP.  Will give IVF's, check labs, UA and head CT.  Pt is DNR per family.          Gavin Pound. Oletta Lamas, MD 10/23/12 1610

## 2012-10-22 NOTE — ED Notes (Signed)
Belongings sent home with family.

## 2012-10-22 NOTE — Progress Notes (Signed)
Clinical Social Work Department BRIEF PSYCHOSOCIAL ASSESSMENT 10/22/2012  Patient:  Angela Cervantes, Angela Cervantes     Account Number:  [id number]     Admit date:  10/22/2012  Clinical Social Worker:  Doree Albee  Date/Time:  10/22/2012 06:05 PM  Referred by:  CSW  Date Referred:  10/22/2012 Referred for  ALF Placement   Other Referral:   Interview type:  Family Other interview type:    PSYCHOSOCIAL DATA Living Status:  FACILITY Admitted from facility:  [facility] Level of care:  Assisted Living Primary support name:  Shary Key Primary support relationship to patient:  CHILD, ADULT Degree of support available:   HCPOA    CURRENT CONCERNS Current Concerns  Post-Acute Placement   Other Concerns:    SOCIAL WORK ASSESSMENT / PLAN CSW met with pt family, patient has history of dementia and unable to engage in [place]. Pt son is Shary Key is pt HCPOA. Patient son shared that patient is a resident at [facility] and in memory care.    Patient family hope for patient to return when medically stable. CSW and pt family discussed that patient will be assessed to determine pt disposition needs. Patient family shared that they are open to any recommendations.   Assessment/plan status:  Psychosocial Support/Ongoing Assessment of Needs Other assessment/ plan:   Information/referral to community resources:   none identified at this time    PATIENT'S/FAMILY'S RESPONSE TO PLAN OF CARE: Paitent family thanked csw for concern and support. Pt family hope for pt to return to [facility] but are open to recommendations if needed.    Catha Gosselin, LCSWA  [phone] .10/22/2012 1810pm

## 2012-10-22 NOTE — Plan of Care (Signed)
Problem: Phase I Progression Outcomes Goal: Voiding-avoid urinary catheter unless indicated Outcome: Progressing Pt voided incontinently before Foley catheter was inserted per MD order to accurate urinary output monitoring.

## 2012-10-22 NOTE — ED Notes (Signed)
Pt pulled off cardiac monitor leads. Attempted to put leads back on, pt is aggressive and cursing stating "I don't want you to do a damn thing for me." Pt is hitting.  Will attempt to put leads back on when family returns to the room. Will continue to monitor.

## 2012-10-22 NOTE — H&P (Signed)
[facility] History and Physical  Glorine Hanratty UJW:[id number] DOB: 08/13/1921 DOA: 10/22/2012  Referring physician: Dr. Loren Racer PCP: Doran Durand, MD   Chief Complaint: Weakness, lethargy, decreased PO intake.   History of Present Illness: Angela Cervantes is an 79 y.o. female with PMH of dementia (from memory care unit), HTN, unspecified renal failure (unknown stage, no prior labs available), DM, who was brought to the hospital for evaluation of lethargy, weakness, low blood glucoses, decreased PO intake.  Her symptoms began about 4 days ago, gradual in onset.  Had an episode of urinary incontinence yesterday, which was unusual for her.  Recent complaints of tooth pain, per family and a psoriasis outbreak to her arms, but otherwise no new complaints.  The patient is too lethargic to answer any questions reliably though she does deny pain and dyspnea.  Review of Systems: Constitutional: No fever, no chills;  Appetite diminished; No weight loss, no weight gain.  HEENT: No blurry vision, no diplopia, no pharyngitis, no dysphagia CV: No chest pain, no palpitations.  Resp: No SOB, no cough. GI: No nausea, no vomiting, no diarrhea, no melena, no hematochezia.  GU: No dysuria, no hematuria.  MSK: no myalgias, no arthralgias.  Neuro:  No headache, no focal neurological deficits, no history of seizures.  Psych: No depression, no anxiety.  Endo: No thyroid disease, no DM, no heat intolerance, + cold intolerance, no polyuria, no polydipsia  Skin: No rashes, + skin lesions.  Heme: No easy bruising, no history of blood diseases.  Past Medical History Past Medical History  Diagnosis Date  . Stroke   . Dementia   . HTN (hypertension)   . Hyperlipidemia   . GERD (gastroesophageal reflux disease)   . Gout   . Depression   . Renal failure      Past Surgical History Past Surgical History  Procedure Laterality Date  . Cholecystectomy    . Total abdominal hysterectomy    . Cataract extraction  w/ intraocular lens implant    . Laminectomy       Social History: History   Social History  . Marital Status: Widowed    Spouse Name: N/A    Number of Children: 2  . Years of Education: N/A   Occupational History  . Retired, Agricultural engineer.    Social History Main Topics  . Smoking status: Never Smoker   . Smokeless tobacco: Never Used  . Alcohol Use: No  . Drug Use: No  . Sexually Active: Not on file   Other Topics Concern  . Not on file   Social History Narrative   Widowed.  Lives in [facility], non-ambulatory at baseline, wheelchair bound.    Family History:  Family History  Problem Relation Age of Onset  . Hypertension Mother   . Hypertension Father     Allergies: Review of patient's allergies indicates no known allergies.  Meds: Prior to Admission medications   Medication Sig Start Date End Date Taking? Authorizing Provider  acetaminophen (MAPAP) 500 MG tablet Take 1,000 mg by mouth every 8 (eight) hours as needed for pain.    Yes Historical Provider, MD  allopurinol (ZYLOPRIM) 100 MG tablet Take 100 mg by mouth every morning.    Yes Historical Provider, MD  amLODipine (NORVASC) 10 MG tablet Take 10 mg by mouth every morning.    Yes Historical Provider, MD  aspirin 81 MG tablet Take 81 mg by mouth every morning.    Yes Historical Provider, MD  cetirizine (ZYRTEC) 10  MG tablet Take 10 mg by mouth every morning. She is taking for 14 days. She started on 10/14/12 and has not completed.   Yes Historical Provider, MD  cloNIDine (CATAPRES - DOSED IN MG/24 HR) 0.2 mg/24hr patch Place 1 patch onto the skin every Monday.   Yes Historical Provider, MD  Cranberry 200 MG CAPS Take 400 mg by mouth every morning.   Yes Historical Provider, MD  docusate (COLACE) 60 MG/15ML syrup Take 40 mg by mouth 2 (two) times daily.   Yes Historical Provider, MD  furosemide (LASIX) 20 MG tablet Take 20 mg by mouth every morning.   Yes Historical Provider, MD  guaifenesin (ROBAFEN)  100 MG/5ML syrup Take 100 mg by mouth every 4 (four) hours as needed for cough.   Yes Historical Provider, MD  hydrALAZINE (APRESOLINE) 50 MG tablet Take 75 mg by mouth every morning.   Yes Historical Provider, MD  insulin aspart (NOVOLOG) 100 UNIT/ML injection Inject 4-12 Units into the skin 3 (three) times daily with meals. She uses a sliding scale: 150-200 = 4 units, 201-250 = 6 units, 251-300 = 8 units, 301-350 = 10 units, 351-400 = 12 units and greater than 400 call MD.   Yes Historical Provider, MD  insulin glargine (LANTUS) 100 UNIT/ML injection Inject 20 Units into the skin at bedtime.   Yes Historical Provider, MD  ipratropium-albuterol (DUONEB) 0.5-2.5 (3) MG/3ML SOLN Take 3 mLs by nebulization every 6 (six) hours as needed (For shortness of breath.).   Yes Historical Provider, MD  lisinopril (PRINIVIL,ZESTRIL) 20 MG tablet Take 20 mg by mouth every morning.   Yes Historical Provider, MD  LORazepam (ATIVAN) 1 MG tablet Take 1 mg by mouth 2 (two) times daily.    Yes Historical Provider, MD  LORazepam (ATIVAN) 1 MG tablet Take 1 mg by mouth every 8 (eight) hours as needed (For agitation.).   Yes Historical Provider, MD  omeprazole (PRILOSEC) 40 MG capsule Take 40 mg by mouth every morning.    Yes Historical Provider, MD  oxybutynin (DITROPAN-XL) 5 MG 24 hr tablet Take 5 mg by mouth every morning.   Yes Historical Provider, MD  polyethylene glycol (MIRALAX / GLYCOLAX) packet Take 17 g by mouth every morning.   Yes Historical Provider, MD  sulfamethoxazole-trimethoprim (BACTRIM DS) 800-160 MG per tablet Take 1 tablet by mouth 2 (two) times daily. She is taking for 14 days. She started on 10/14/12 and has not completed.   Yes Historical Provider, MD  triamcinolone cream (KENALOG) 0.5 % Apply 1 application topically 3 (three) times daily. Applies to rash on forearm, upper back and arms in the morning and then to upper back and arms in the evening.   Yes Historical Provider, MD  ZINC OXIDE EX Apply 1  application topically 2 (two) times daily. Applies to skin breakdown on bottom.   Yes Historical Provider, MD    Physical Exam: Filed Vitals:   10/22/12 1327 10/22/12 1605 10/22/12 1657  BP: 135/45  142/41  Pulse: 68  79  Temp: 97.8 F (36.6 C) 96.8 F (36 C)   TempSrc: Oral Rectal   Resp: 20  19  SpO2: 98%  99%     Physical Exam: Blood pressure 142/41, pulse 79, temperature 96.8 F (36 C), temperature source Rectal, resp. rate 19, SpO2 99.00%. Gen: No acute distress. Very lethargic. Head: Normocephalic, atraumatic. Eyes: PERRL, EOMI, sclerae nonicteric. Mouth: Oropharynx with moist mucous membranes. Neck: Supple, no thyromegaly, no lymphadenopathy, no jugular venous distention. Chest: Lungs  diminished. CV: Heart sounds are regular without murmurs, rubs, or gallops. Abdomen: Soft, distended/firm but nontender. Extremities: Extremities are without clubbing, edema, or cyanosis. Skin: Warm and dry. Psoriatic plaques to the upper extremity forearms. Neuro: Lethargic and disoriented; cranial nerves II through XII grossly intact but difficult to evaluate secondary to poor patient cooperation. Psych: Mood and affect flat.  Labs on Admission:  Basic Metabolic Panel:  Recent Labs Lab 10/22/12 1530  NA 139  K 6.1*  CL 112  CO2 14*  GLUCOSE 89  BUN 85*  CREATININE 3.07*  CALCIUM 10.0   Liver Function Tests:  Recent Labs Lab 10/22/12 1530  AST 23  ALT 19  ALKPHOS 109  BILITOT 0.1*  PROT 8.3  ALBUMIN 3.6   CBC:  Recent Labs Lab 10/22/12 1530  WBC 6.8  NEUTROABS 3.5  HGB 11.2*  HCT 35.5*  MCV 84.9  PLT 170   CBG:  Recent Labs Lab 10/22/12 1324  GLUCAP 89   Urinalysis    Component Value Date/Time   COLORURINE YELLOW 10/22/2012 1557   APPEARANCEUR CLEAR 10/22/2012 1557   LABSPEC 1.013 10/22/2012 1557   PHURINE 6.0 10/22/2012 1557   GLUCOSEU NEGATIVE 10/22/2012 1557   HGBUR NEGATIVE 10/22/2012 1557   BILIRUBINUR NEGATIVE 10/22/2012 1557   KETONESUR NEGATIVE  10/22/2012 1557   PROTEINUR NEGATIVE 10/22/2012 1557   UROBILINOGEN 0.2 10/22/2012 1557   NITRITE NEGATIVE 10/22/2012 1557   LEUKOCYTESUR NEGATIVE 10/22/2012 1557   Radiological Exams on Admission: Dg Chest 2 View  10/22/2012  *RADIOLOGY REPORT*  Clinical Data: Altered mental status  CHEST - 2 VIEW  Comparison: 12/03/2010  Findings: The heart and pulmonary vascularity are within normal limits.  Previously seen abnormality within the right lung is no longer identified.  The lungs are clear.  No acute bony abnormality is seen.  IMPRESSION: No acute abnormality noted.   Original Report Authenticated By: Alcide Clever, M.D.    Ct Head Wo Contrast  10/22/2012  *RADIOLOGY REPORT*  Clinical Data: Weakness, altered mental status  CT HEAD WITHOUT CONTRAST  Technique:  Contiguous axial images were obtained from the base of the skull through the vertex without contrast.  Comparison: None.  Findings: No acute intracranial hemorrhage, acute infarction, mass lesion, mass effect, midline shift or hydrocephalus.  Gray-white differentiation is preserved throughout.  Well defined hypoattenuation in the anterior limb of the right internal capsule consistent with remote lacunar infarct versus dilated perivascular space.  Cerebral and cerebellar atrophy is within normal limits for age.  Periventricular, subcortical and deep white matter hypoattenuation is most consistent with the sequela of longstanding microvascular ischemic change.  The globes are intact.  Surgical changes of prior left lens extraction.  The orbits are symmetric and unremarkable.  No focal soft tissue abnormality.  Normal aeration of the mastoid air cells and visualized paranasal sinuses. Small amount of frothy material in the right sphenoid air cell. Diffuse atherosclerotic calcifications in the intracranial carotid arteries. Abnormal expansile ground-glass appearance of the left sphenoid bone primarily involving the greater wing and extending to the lateral temporal  calvarium were there is a focal bony excrescence.  IMPRESSION:  1.  No acute intracranial abnormality. 2.  Expansile irregular ground-glass and sclerotic appearance of the left sphenoid bone extending to the outer table of the calvarium where there is a there is a focal bony excrescence.  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 Additional differential considerations include Paget's disease and significantly less likely a sclerotic metastasis. 3.  Mild atrophy  and moderate chronic ischemic white matter changes 4.  Remote right basal ganglia lacunar infarct versus dilated perivascular space 5.  Intracranial atherosclerosis 6.  Small amount of fluid layers within the right sphenoid air cell, likely incidental.   Original Report Authenticated By: Malachy Moan, M.D.     EKG: Independently reviewed. Normal sinus rhythm at 72 beats per minute. No peaked T waves.  Assessment/Plan Principal Problem:   Acute renal failure in the setting of chronic kidney disease of unknown stage -Likely from decreased by mouth intake with ongoing diuretic/ACE inhibitor use. -Hold diuretics/ACE inhibitor. -Hydrate. -Attempt to contact PCP to determine baseline creatinine. -Consider renal ultrasound if no improvement in renal function with hydration. Active Problems:   Hyperkalemia -Hydrate. -Kayexalate enema given altered mental status making by mouth intake risky.   Hypertension -Hold antihypertensives except for clonidine patch for now.   Metabolic acidosis -Hydrate with IV fluids containing bicarbonate.   Normocytic anemia of chronic kidney disease -No current indication for transfusion.   Metabolic encephalopathy -Likely from acute renal failure and decreased renal clearance of sedating medications. -No evidence of acute intracranial abnormality on CT of the head. -Check TSH and B12. -Hold Ativan.   Psoriasis -Continue steroid cream.   Overactive bladder -Hold Ditropan until able  to safely take by mouth medications.   GERD (gastroesophageal reflux disease) -Hold PPI therapy until able to safely take by mouth medications.   Diabetes -Hold Lantus. Patient has been having hypoglycemic episodes at her facility. -Can use insulin sensitive SSI if needed.   Gout -Hold allopurinol until able to safely take by mouth medications.   Code Status: DNR Family Communication: Shary Key (son) [phone] (cell).  Francesco Runner [phone] Disposition Plan: Back to ALF when stable.  Time spent: 65 minutes.  Rachell Druckenmiller [facility] Pager [phone]  If 7PM-7AM, please contact night-coverage [email or web address] Password [facility] 10/22/2012, 5:45 PM

## 2012-10-22 NOTE — ED Notes (Signed)
ZOX:[id number]<AV> Expected date:<BR> Expected time:<BR> Means of arrival:Ambulance<BR> Comments:<BR> Hypoglycemic/AMS

## 2012-10-22 NOTE — ED Notes (Addendum)
PER EMS- pt picked up from [facility] unit w c/o weakness and 'drops in blood sugar', and  loss of appetite.  PCP put in orders for pt to go to ED to be evaluated. Alert and oriented per baseline. Hx of alzheimer's.

## 2012-10-22 NOTE — ED Notes (Addendum)
Called to give report, per secretary all RNs are busy.  Will return phone call in .

## 2012-10-22 NOTE — ED Notes (Signed)
Pt became aggressive/agitated during in and out cath earlier - family was able to ease pt and staff was able to obtain sample.  Due to earlier reaction, staff is awaiting family's return from dinner to proceed with foley order.  Ok per [place], [place]

## 2012-10-23 LAB — GLUCOSE, CAPILLARY
Glucose-Capillary: 141 mg/dL — ABNORMAL HIGH (ref 70–99)
Glucose-Capillary: 109 mg/dL — ABNORMAL HIGH (ref 70–99)
Glucose-Capillary: 105 mg/dL — ABNORMAL HIGH (ref 70–99)

## 2012-10-23 LAB — CBC
HCT: 33 % — ABNORMAL LOW (ref 36.0–46.0)
Hemoglobin: 10.6 g/dL — ABNORMAL LOW (ref 12.0–15.0)
MCH: 27.1 pg (ref 26.0–34.0)
RBC: 3.91 MIL/uL (ref 3.87–5.11)

## 2012-10-23 LAB — BASIC METABOLIC PANEL
BUN: 70 mg/dL — ABNORMAL HIGH (ref 6–23)
Chloride: 109 mEq/L (ref 96–112)
GFR calc Af Amer: 20 mL/min — ABNORMAL LOW (ref >90)
GFR calc non Af Amer: 17 mL/min — ABNORMAL LOW (ref >90)
Glucose, Bld: 110 mg/dL — ABNORMAL HIGH (ref 70–99)
Potassium: 5.2 mEq/L — ABNORMAL HIGH (ref 3.5–5.1)
Sodium: 140 mEq/L (ref 135–145)

## 2012-10-23 LAB — TSH: TSH: 4.345 u[IU]/mL (ref 0.350–4.500)

## 2012-10-23 LAB — VITAMIN B12: Vitamin B-12: 455 pg/mL (ref 211–911)

## 2012-10-23 LAB — MRSA PCR SCREENING: MRSA by PCR: POSITIVE — AB

## 2012-10-23 MED ORDER — DOCUSATE SODIUM 60 MG/15ML PO SYRP: 40.0000 mg | ORAL_SOLUTION | Freq: Two times a day (BID) | ORAL | Status: DC

## 2012-10-23 MED ORDER — CHLORHEXIDINE GLUCONATE CLOTH 2 % EX PADS
6.0000 | MEDICATED_PAD | Freq: Every day | CUTANEOUS | Status: DC
  Administered 2012-10-23 – 2012-10-24 (×2): 6 via TOPICAL

## 2012-10-23 MED ORDER — ASPIRIN 81 MG PO TABS
81.0000 mg | ORAL_TABLET | Freq: Every morning | ORAL | Status: DC
Start: 2012-10-23 — End: 2012-10-23

## 2012-10-23 MED ORDER — PANTOPRAZOLE SODIUM 40 MG PO TBEC
40.0000 mg | DELAYED_RELEASE_TABLET | Freq: Every day | ORAL | Status: DC
  Administered 2012-10-23 – 2012-10-24 (×2): 40 mg via ORAL
  Filled 2012-10-23 (×2): qty 1

## 2012-10-23 MED ORDER — OXYBUTYNIN CHLORIDE ER 5 MG PO TB24
5.0000 mg | ORAL_TABLET | Freq: Every morning | ORAL | Status: DC
  Administered 2012-10-23 – 2012-10-24 (×2): 5 mg via ORAL
  Filled 2012-10-23 (×2): qty 1

## 2012-10-23 MED ORDER — AMLODIPINE BESYLATE 10 MG PO TABS
10.0000 mg | ORAL_TABLET | Freq: Every morning | ORAL | Status: DC
  Administered 2012-10-23 – 2012-10-24 (×2): 10 mg via ORAL
  Filled 2012-10-23 (×2): qty 1

## 2012-10-23 MED ORDER — INSULIN ASPART 100 UNIT/ML ~~LOC~~ SOLN
0.0000 [IU] | Freq: Three times a day (TID) | SUBCUTANEOUS | Status: DC
  Administered 2012-10-23: 1 [IU] via SUBCUTANEOUS

## 2012-10-23 MED ORDER — ALLOPURINOL 100 MG PO TABS
100.0000 mg | ORAL_TABLET | Freq: Every morning | ORAL | Status: DC
  Administered 2012-10-23 – 2012-10-24 (×2): 100 mg via ORAL
  Filled 2012-10-23 (×2): qty 1

## 2012-10-23 MED ORDER — POLYETHYLENE GLYCOL 3350 17 G PO PACK
17.0000 g | PACK | Freq: Every morning | ORAL | Status: DC
  Administered 2012-10-23: 17 g via ORAL
  Filled 2012-10-23 (×2): qty 1

## 2012-10-23 MED ORDER — LORATADINE 10 MG PO TABS
10.0000 mg | ORAL_TABLET | Freq: Every day | ORAL | Status: DC
  Administered 2012-10-23 – 2012-10-24 (×2): 10 mg via ORAL
  Filled 2012-10-23 (×2): qty 1

## 2012-10-23 MED ORDER — MUPIROCIN 2 % EX OINT
1.0000 "application " | TOPICAL_OINTMENT | Freq: Two times a day (BID) | CUTANEOUS | Status: DC
  Administered 2012-10-23 – 2012-10-24 (×2): 1 via NASAL
  Filled 2012-10-23: qty 22

## 2012-10-23 MED ORDER — SODIUM CHLORIDE 0.9 % IV SOLN
INTRAVENOUS | Status: DC
  Administered 2012-10-23 – 2012-10-24 (×3): via INTRAVENOUS

## 2012-10-23 MED ORDER — DOCUSATE SODIUM 50 MG/5ML PO LIQD
40.0000 mg | Freq: Two times a day (BID) | ORAL | Status: DC
  Administered 2012-10-23: 40 mg via ORAL
  Filled 2012-10-23 (×4): qty 10

## 2012-10-23 NOTE — Progress Notes (Addendum)
[facility] PROGRESS NOTE  Angela Cervantes ZOX:[id number] DOB: 1921-09-14 DOA: 10/22/2012 PCP: Doran Durand, MD  Brief narrative: Angela Cervantes is an 79 y.o. female with PMH of dementia (from memory care unit), HTN, unspecified renal failure (baseline creatinine around 2), DM, who was brought to the hospital for evaluation of lethargy, weakness, low blood glucoses, decreased PO intake. Her symptoms began about 4 days prior to admission, gradual in onset. Upon initial evaluation in the ER, the patient was lethargic and had a creatinine of 3.07 associated with hyperkalemia and metabolic acidosis.  Assessment/Plan: Principal Problem:  Acute renal failure in the setting of stage III chronic kidney disease -Likely from decreased by mouth intake with ongoing diuretic/ACE inhibitor use.  -Continue to hold diuretics/ACE inhibitor.  -Creatinine improving with IVF.  Baseline creatinine around 2 per PCP. Active Problems:  Hyperkalemia  -Improved with IVF.    Hypertension  -Continue clonidine patch, resume Norvasc.  Hold others.  Metabolic acidosis  -Resolved.  Switch IVF to NS.  Normocytic anemia of chronic kidney disease  -No current indication for transfusion.  Metabolic encephalopathy  -Likely from acute renal failure and decreased renal clearance of sedating medications.  -No evidence of acute intracranial abnormality on CT of the head.  -No evidence of UTI or PNA. -TSH and B12 pending.  -Hold Ativan.  Psoriasis  -Continue steroid cream.  Overactive bladder  -Resume Ditropan.  GERD (gastroesophageal reflux disease)  -Resume PPI.  Diabetes  -Continue to hold Lantus. -CBGs 77-112. -Continue insulin sensitive SSI if needed.  Gout  -Resume allopurinol.   Code Status: DNR  Family Communication: Shary Key (son) [phone] (cell). Francesco Runner [phone]. Dianne updated by telephone. Disposition Plan: Back to ALF 10/24/12 if remains stable.   Medical  Consultants:  None.  Other Consultants:  None.  Anti-infectives:  None.  HPI/Subjective: Angela Cervantes is awake, alert, answering questions appropriately.  She denies pain, dyspnea, nausea, vomiting, diarrhea.  She wants to get up for a bath!  Does endorse an occasional cough but no pleuritic chest pain.  Objective: Filed Vitals:   10/22/12 1605 10/22/12 1657 10/22/12 1920 10/23/12 0413  BP:  142/41 107/74 151/63  Pulse:  79 73 66  Temp: 96.8 F (36 C)  98.7 F (37.1 C) 97.6 F (36.4 C)  TempSrc: Rectal   Oral  Resp:  19 18 18   Height:   5' 6.14" (1.68 m)   Weight:   78.926 kg (174 lb)   SpO2:  99% 98% 99%    Intake/Output Summary (Last 24 hours) at 10/23/12 0721 Last data filed at 10/23/12 0500  Gross per 24 hour  Intake   1887 ml  Output   1200 ml  Net    687 ml    Exam: Gen:  NAD Cardiovascular:  RRR, No M/R/G Respiratory:  Lungs CTAB Gastrointestinal:  Abdomen soft, NT/ND, + BS Extremities:  No C/E/C  Data Reviewed: Basic Metabolic Panel:  Recent Labs Lab 10/22/12 1530 10/23/12 0438  NA 139 140  K 6.1* 5.2*  CL 112 109  CO2 14* 20  GLUCOSE 89 110*  BUN 85* 70*  CREATININE 3.07* 2.37*  CALCIUM 10.0 9.9   GFR Estimated Creatinine Clearance: 17.4 ml/min (by C-G formula based on Cr of 2.37). Liver Function Tests:  Recent Labs Lab 10/22/12 1530  AST 23  ALT 19  ALKPHOS 109  BILITOT 0.1*  PROT 8.3  ALBUMIN 3.6   Coagulation profile  Recent Labs Lab 10/22/12 1530  INR 1.18    CBC:  Recent Labs Lab 10/22/12 1530 10/23/12 0438  WBC 6.8 6.0  NEUTROABS 3.5  --   HGB 11.2* 10.6*  HCT 35.5* 33.0*  MCV 84.9 84.4  PLT 170 179   CBG:  Recent Labs Lab 10/22/12 1324 10/22/12 2005 10/22/12 2352 10/23/12 0410  GLUCAP 89 77 112* 108*   Thyroid function studies No results found for this basename: TSH, T4TOTAL, FREET3, T3FREE, THYROIDAB,  in the last 72 hours  Microbiology Recent Results (from the past 240 hour(s))  MRSA PCR  SCREENING     Status: Abnormal   Collection Time    10/22/12 11:55 PM      Result Value Range Status   MRSA by PCR POSITIVE (*) NEGATIVE Final   Comment:            The GeneXpert MRSA Assay (FDA     approved for NASAL specimens     only), is one component of a     comprehensive MRSA colonization     surveillance program. It is not     intended to diagnose MRSA     infection nor to guide or     monitor treatment for     MRSA infections.     RESULT CALLED TO, READ BACK BY AND VERIFIED WITH:     C. GIVENS RN AT 0315 ON 05.10.14 BY SHUEA     Procedures and Diagnostic Studies: Dg Chest 2 View  10/22/2012  *RADIOLOGY REPORT*  Clinical Data: Altered mental status  CHEST - 2 VIEW  Comparison: 12/03/2010  Findings: The heart and pulmonary vascularity are within normal limits.  Previously seen abnormality within the right lung is no longer identified.  The lungs are clear.  No acute bony abnormality is seen.  IMPRESSION: No acute abnormality noted.   Original Report Authenticated By: Alcide Clever, M.D.    Ct Head Wo Contrast  10/22/2012  *RADIOLOGY REPORT*  Clinical Data: Weakness, altered mental status  CT HEAD WITHOUT CONTRAST  Technique:  Contiguous axial images were obtained from the base of the skull through the vertex without contrast.  Comparison: None.  Findings: No acute intracranial hemorrhage, acute infarction, mass lesion, mass effect, midline shift or hydrocephalus.  Gray-white differentiation is preserved throughout.  Well defined hypoattenuation in the anterior limb of the right internal capsule consistent with remote lacunar infarct versus dilated perivascular space.  Cerebral and cerebellar atrophy is within normal limits for age.  Periventricular, subcortical and deep white matter hypoattenuation is most consistent with the sequela of longstanding microvascular ischemic change.  The globes are intact.  Surgical changes of prior left lens extraction.  The orbits are symmetric and  unremarkable.  No focal soft tissue abnormality.  Normal aeration of the mastoid air cells and visualized paranasal sinuses. Small amount of frothy material in the right sphenoid air cell. Diffuse atherosclerotic calcifications in the intracranial carotid arteries. Abnormal expansile ground-glass appearance of the left sphenoid bone primarily involving the greater wing and extending to the lateral temporal calvarium were there is a focal bony excrescence.  IMPRESSION:  1.  No acute intracranial abnormality. 2.  Expansile irregular ground-glass and sclerotic appearance of the left sphenoid bone extending to the outer table of the calvarium where there is a there is a focal bony excrescence.  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 Additional differential considerations include Paget's disease and significantly less likely a sclerotic metastasis. 3.  Mild atrophy and moderate chronic ischemic white matter changes 4.  Remote right basal ganglia  lacunar infarct versus dilated perivascular space 5.  Intracranial atherosclerosis 6.  Small amount of fluid layers within the right sphenoid air cell, likely incidental.   Original Report Authenticated By: Malachy Moan, M.D.     Scheduled Meds: . [START ON 10/29/2012] cloNIDine  0.2 mg Transdermal Q7 days  . enoxaparin (LOVENOX) injection  30 mg Subcutaneous Q24H  . insulin aspart  0-9 Units Subcutaneous Q4H  . triamcinolone cream  1 application Topical TID   Continuous Infusions: . dextrose 5 % 1,000 mL with sodium bicarbonate 100 mEq infusion 125 mL/hr at 10/23/12 0500    Time spent: 35 minutes.   LOS: 1 day   Litha Lamartina  [facility] Pager [phone].  If 8PM-8AM, please contact night-coverage at [email or web address], password [facility] 10/23/2012, 7:21 AM

## 2012-10-24 DIAGNOSIS — N179 Acute kidney failure, unspecified: Secondary | ICD-10-CM

## 2012-10-24 DIAGNOSIS — E875 Hyperkalemia: Secondary | ICD-10-CM

## 2012-10-24 DIAGNOSIS — E119 Type 2 diabetes mellitus without complications: Secondary | ICD-10-CM

## 2012-10-24 DIAGNOSIS — G9341 Metabolic encephalopathy: Secondary | ICD-10-CM

## 2012-10-24 LAB — URINE CULTURE
Colony Count: NO GROWTH
Culture: NO GROWTH

## 2012-10-24 LAB — BASIC METABOLIC PANEL
CO2: 19 mEq/L (ref 19–32)
Calcium: 10 mg/dL (ref 8.4–10.5)
Creatinine, Ser: 1.51 mg/dL — ABNORMAL HIGH (ref 0.50–1.10)
GFR calc Af Amer: 34 mL/min — ABNORMAL LOW (ref >90)
GFR calc non Af Amer: 30 mL/min — ABNORMAL LOW (ref >90)
Sodium: 143 mEq/L (ref 135–145)

## 2012-10-24 LAB — GLUCOSE, CAPILLARY
Glucose-Capillary: 94 mg/dL (ref 70–99)
Glucose-Capillary: 75 mg/dL (ref 70–99)

## 2012-10-24 MED ORDER — LABETALOL HCL 200 MG PO TABS
200.0000 mg | ORAL_TABLET | Freq: Three times a day (TID) | ORAL | Status: DC
  Administered 2012-10-24: 200 mg via ORAL
  Filled 2012-10-24 (×3): qty 1

## 2012-10-24 MED ORDER — LORAZEPAM 1 MG PO TABS: 1.0000 mg | ORAL_TABLET | Freq: Three times a day (TID) | ORAL | Status: DC | PRN

## 2012-10-24 MED ORDER — CLONIDINE HCL 0.3 MG/24HR TD PTWK: 0.3000 mg | MEDICATED_PATCH | TRANSDERMAL | Status: DC

## 2012-10-24 MED ORDER — LABETALOL HCL 200 MG PO TABS: 200.0000 mg | ORAL_TABLET | Freq: Three times a day (TID) | ORAL | Status: AC

## 2012-10-24 MED ORDER — CLONIDINE HCL 0.3 MG/24HR TD PTWK: 1.0000 | MEDICATED_PATCH | TRANSDERMAL | Status: AC

## 2012-10-24 MED ORDER — MUPIROCIN 2 % EX OINT: 1.0000 "application " | TOPICAL_OINTMENT | Freq: Two times a day (BID) | CUTANEOUS | Status: DC

## 2012-10-24 MED ORDER — SODIUM POLYSTYRENE SULFONATE 15 GM/60ML PO SUSP
15.0000 g | Freq: Once | ORAL | Status: AC
  Administered 2012-10-24: 15 g via ORAL
  Filled 2012-10-24: qty 60

## 2012-10-24 NOTE — Progress Notes (Signed)
Patient discharged to [facility] via ambulance. Report called to RN at facility.

## 2012-10-24 NOTE — Discharge Summary (Signed)
Physician Discharge Summary  Angela Cervantes ZOX:[id number] DOB: [birth year] DOA: 10/22/2012  PCP: Doran Durand, MD  Admit date: 10/22/2012 Discharge date: 10/24/2012  Recommendations for Outpatient Follow-up:  1. Note: The patient's anti-hypertensive regimen has changed.  ACE-I contraindicated at this time secondary to hyperkalemia.  Can resume Lasix, if needed, but would try to control BP with alternative agents.  2. Ativan changed to PRN only, no standing doses recommended due to the potential for the build up of toxic metabolites in the face of CKD. 3. Lantus discontinued at D/C secondary to lower CBG readings.  This can be added back and titrated if indicated.  Recommend close attention to glycemic control. 4. Recommend F/U BMET (creatinine, potassium) check in 3-4 days.  Discharge Diagnoses:  Principal Problem:    Acute renal failure in the setting of stage III chronic kidney disease  Active Problems:    Hyperkalemia    Hypertension    Metabolic acidosis    Normocytic anemia    Metabolic encephalopathy    Psoriasis    Overactive bladder    GERD (gastroesophageal reflux disease)    Diabetes    Hypoglycemia    Gout   Discharge Condition: Improved.  Diet recommendation: Low sodium, heart healthy, carbohydrate modified.  History of present illness:  Angela Cervantes is an 79 y.o. female with PMH of dementia (from memory care unit), HTN, stage III CKD (baseline creatinine around 2), DM, who was brought to the hospital for evaluation of lethargy, weakness, low blood glucoses, decreased PO intake. Her symptoms began about 4 days prior to admission, gradual in onset. Upon initial evaluation in the ER, the patient was lethargic and had a creatinine of 3.07 associated with hyperkalemia and metabolic acidosis.  Hospital Course by problem:  Principal Problem:  Acute renal failure in the setting of stage III chronic kidney disease  -Likely from decreased by mouth intake with ongoing  diuretic/ACE inhibitor use.  -Continue to hold diuretics/ACE inhibitor.  -Creatinine improved to 1.5 after IVF. Baseline creatinine around 2 per PCP.  -Recommend cautious use of diuretics/ACE inhibitor in the future if needed.  Recommend holding these medications until F/U with PCP.   Active Problems:  Hyperkalemia  -Improved with IVF. Will give a dose of Kayexalate prior to discharge. Hypertension  -Continue clonidine patch (increase dose), continue Norvasc. Resume Hydralazine.  Add Labetalol.  -Continue to hold Lasix and ACE at discharge. Metabolic acidosis  -Resolved with IVF containing bicarbonate and improvement in renal function.  Normocytic anemia of chronic kidney disease  -No current indication for transfusion.  Metabolic encephalopathy  -Likely from acute renal failure and decreased renal clearance of sedating medications.  -Hypoglycemia also likely contributory (decreased insulin clearance in the face of ARF). -No evidence of acute intracranial abnormality on CT of the head.  -No evidence of UTI or PNA.  -TSH and B12 WNL.  -Only give Ativan PRN, avoid scheduled doses. Psoriasis  -Continue steroid cream.  Overactive bladder  -Resume Ditropan.  GERD (gastroesophageal reflux disease)  -Resume PPI.  Diabetes  -Continue to hold Lantus. May need to re-assess insulin needs in the face of CKD. -Continue insulin sensitive SSI if needed.  Gout  -Resume allopurinol.    Discharge Exam: Filed Vitals:   10/24/12 0503  BP: 180/59  Pulse: 72  Temp: 98 F (36.7 C)  Resp: 16   Filed Vitals:   10/23/12 0413 10/23/12 1500 10/23/12 2202 10/24/12 0503  BP: 151/63 171/52 160/53 180/59  Pulse: 66 80 77 72  Temp: 97.6 F (  36.4 C) 98.5 F (36.9 C) 98.3 F (36.8 C) 98 F (36.7 C)  TempSrc: Oral Axillary Oral Oral  Resp: 18 16 18 16   Height:      Weight:      SpO2: 99% 100% 99% 100%    Gen:  NAD, awake and alert Cardiovascular:  RRR, No M/R/G Respiratory: Lungs  CTAB Gastrointestinal: Abdomen soft, NT/ND with normal active bowel sounds. Extremities: No C/E/C   Discharge Instructions  Discharge Orders   Future Orders Complete By Expires     Call MD for:  extreme fatigue  As directed     Call MD for:  temperature >100.4  As directed     Diet - low sodium heart healthy  As directed     Diet Carb Modified  As directed     Increase activity slowly  As directed     Walk with assistance  As directed         Medication List    STOP taking these medications       cloNIDine 0.2 mg/24hr patch  Commonly known as:  CATAPRES - Dosed in mg/24 hr  Replaced by:  cloNIDine 0.3 mg/24hr     furosemide 20 MG tablet  Commonly known as:  LASIX     insulin glargine 100 UNIT/ML injection  Commonly known as:  LANTUS     lisinopril 20 MG tablet  Commonly known as:  PRINIVIL,ZESTRIL     sulfamethoxazole-trimethoprim 800-160 MG per tablet  Commonly known as:  BACTRIM DS      TAKE these medications       allopurinol 100 MG tablet  Commonly known as:  ZYLOPRIM  Take 100 mg by mouth every morning.     amLODipine 10 MG tablet  Commonly known as:  NORVASC  Take 10 mg by mouth every morning.     cetirizine 10 MG tablet  Commonly known as:  ZYRTEC  Take 10 mg by mouth every morning. She is taking for 14 days. She started on 10/14/12 and has not completed.     cloNIDine 0.3 mg/24hr  Commonly known as:  CATAPRES - Dosed in mg/24 hr  Place 1 patch (0.3 mg total) onto the skin every 7 (seven) days.  Start taking on:  10/29/2012     COLACE 60 MG/15ML syrup  Generic drug:  docusate  Take 40 mg by mouth 2 (two) times daily.     Cranberry 200 MG Caps  Take 400 mg by mouth every morning.     hydrALAZINE 50 MG tablet  Commonly known as:  APRESOLINE  Take 75 mg by mouth every morning.     insulin aspart 100 UNIT/ML injection  Commonly known as:  novoLOG  Inject 4-12 Units into the skin 3 (three) times daily with meals. She uses a sliding scale: 150-200  = 4 units, 201-250 = 6 units, 251-300 = 8 units, 301-350 = 10 units, 351-400 = 12 units and greater than 400 call MD.     ipratropium-albuterol 0.5-2.5 (3) MG/3ML Soln  Commonly known as:  DUONEB  Take 3 mLs by nebulization every 6 (six) hours as needed (For shortness of breath.).     labetalol 200 MG tablet  Commonly known as:  NORMODYNE  Take 1 tablet (200 mg total) by mouth 3 (three) times daily.     LORazepam 1 MG tablet  Commonly known as:  ATIVAN  Take 1 tablet (1 mg total) by mouth every 8 (eight) hours as needed (For agitation.).  MAPAP 500 MG tablet  Generic drug:  acetaminophen  Take 1,000 mg by mouth every 8 (eight) hours as needed for pain.     mupirocin ointment 2 %  Commonly known as:  BACTROBAN  Apply 1 application topically 2 (two) times daily. For 1 week.     omeprazole 40 MG capsule  Commonly known as:  PRILOSEC  Take 40 mg by mouth every morning.     oxybutynin 5 MG 24 hr tablet  Commonly known as:  DITROPAN-XL  Take 5 mg by mouth every morning.     polyethylene glycol packet  Commonly known as:  MIRALAX / GLYCOLAX  Take 17 g by mouth every morning.     ROBAFEN 100 MG/5ML syrup  Generic drug:  guaifenesin  Take 100 mg by mouth every 4 (four) hours as needed for cough.     triamcinolone cream 0.5 %  Commonly known as:  KENALOG  Apply 1 application topically 3 (three) times daily. Applies to rash on forearm, upper back and arms in the morning and then to upper back and arms in the evening.     ZINC OXIDE EX  Apply 1 application topically 2 (two) times daily. Applies to skin breakdown on bottom.      ASK your doctor about these medications       aspirin 81 MG tablet  Take 81 mg by mouth every morning.          The results of significant diagnostics from this hospitalization (including imaging, microbiology, ancillary and laboratory) are listed below for reference.    Significant Diagnostic Studies: Dg Chest 2 View  10/22/2012  *RADIOLOGY  REPORT*  Clinical Data: Altered mental status  CHEST - 2 VIEW  Comparison: 12/03/2010  Findings: The heart and pulmonary vascularity are within normal limits.  Previously seen abnormality within the right lung is no longer identified.  The lungs are clear.  No acute bony abnormality is seen.  IMPRESSION: No acute abnormality noted.   Original Report Authenticated By: Alcide Clever, M.D.    Ct Head Wo Contrast  10/22/2012  *RADIOLOGY REPORT*  Clinical Data: Weakness, altered mental status  CT HEAD WITHOUT CONTRAST  Technique:  Contiguous axial images were obtained from the base of the skull through the vertex without contrast.  Comparison: None.  Findings: No acute intracranial hemorrhage, acute infarction, mass lesion, mass effect, midline shift or hydrocephalus.  Gray-white differentiation is preserved throughout.  Well defined hypoattenuation in the anterior limb of the right internal capsule consistent with remote lacunar infarct versus dilated perivascular space.  Cerebral and cerebellar atrophy is within normal limits for age.  Periventricular, subcortical and deep white matter hypoattenuation is most consistent with the sequela of longstanding microvascular ischemic change.  The globes are intact.  Surgical changes of prior left lens extraction.  The orbits are symmetric and unremarkable.  No focal soft tissue abnormality.  Normal aeration of the mastoid air cells and visualized paranasal sinuses. Small amount of frothy material in the right sphenoid air cell. Diffuse atherosclerotic calcifications in the intracranial carotid arteries. Abnormal expansile ground-glass appearance of the left sphenoid bone primarily involving the greater wing and extending to the lateral temporal calvarium were there is a focal bony excrescence.  IMPRESSION:  1.  No acute intracranial abnormality. 2.  Expansile irregular ground-glass and sclerotic appearance of the left sphenoid bone extending to the outer table of the calvarium  where there is a there is a focal bony excrescence.  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 Additional differential considerations include Paget's disease and significantly less likely a sclerotic metastasis. 3.  Mild atrophy and moderate chronic ischemic white matter changes 4.  Remote right basal ganglia lacunar infarct versus dilated perivascular space 5.  Intracranial atherosclerosis 6.  Small amount of fluid layers within the right sphenoid air cell, likely incidental.   Original Report Authenticated By: Malachy Moan, M.D.     Labs:  Basic Metabolic Panel:  Recent Labs Lab 10/22/12 1530 10/23/12 0438 10/24/12 0724  NA 139 140 143  K 6.1* 5.2* 5.3*  CL 112 109 114*  CO2 14* 20 19  GLUCOSE 89 110* 108*  BUN 85* 70* 40*  CREATININE 3.07* 2.37* 1.51*  CALCIUM 10.0 9.9 10.0   GFR Estimated Creatinine Clearance: 27.4 ml/min (by C-G formula based on Cr of 1.51). Liver Function Tests:  Recent Labs Lab 10/22/12 1530  AST 23  ALT 19  ALKPHOS 109  BILITOT 0.1*  PROT 8.3  ALBUMIN 3.6   Coagulation profile  Recent Labs Lab 10/22/12 1530  INR 1.18    CBC:  Recent Labs Lab 10/22/12 1530 10/23/12 0438  WBC 6.8 6.0  NEUTROABS 3.5  --   HGB 11.2* 10.6*  HCT 35.5* 33.0*  MCV 84.9 84.4  PLT 170 179   CBG:  Recent Labs Lab 10/23/12 0738 10/23/12 1155 10/23/12 1650 10/23/12 2158 10/24/12 0732  GLUCAP 90 141* 109* 105* 94   Thyroid function studies  Recent Labs  10/23/12 0438  TSH 4.345   Anemia work up  Recent Labs  10/23/12 0438  [id number]   Microbiology Recent Results (from the past 240 hour(s))  MRSA PCR SCREENING     Status: Abnormal   Collection Time    10/22/12 11:55 PM      Result Value Range Status   MRSA by PCR POSITIVE (*) NEGATIVE Final   Comment:            The GeneXpert MRSA Assay (FDA     approved for NASAL specimens     only), is one component of a     comprehensive MRSA colonization      surveillance program. It is not     intended to diagnose MRSA     infection nor to guide or     monitor treatment for     MRSA infections.     RESULT CALLED TO, READ BACK BY AND VERIFIED WITH:     C. GIVENS RN AT 0315 ON 05.10.14 BY SHUEA    Time coordinating discharge: 40 minutes.  Signed:  RAMA,CHRISTINA  Pager [phone] [facility] 10/24/2012, 8:49 AM

## 2012-10-24 NOTE — Progress Notes (Signed)
Per MD, Pt ready for d/c.  Notified RN, family and facility.  Family requesting that Pt be transported via ambulance, as they are not able to accommodate Pt in their private vehicle.  Sent d/c summary.  Confirmed receipt of d/c summary.  Facility ready to receive Pt.  Arranged for transportation.  Providence Crosby, LCSWA Clinical Social Work [phone]

## 2012-12-18 IMAGING — CR DG CHEST 2V
1 series · 2 of 2 positions shown · non-contrast
Comparison: none

REASON FOR EXAM: ams
COMMENTS:   May transport without cardiac monitor

[Series 1: x chest ap · 0.14mm/px · 2 of 2 slices shown]
[im 1/2]
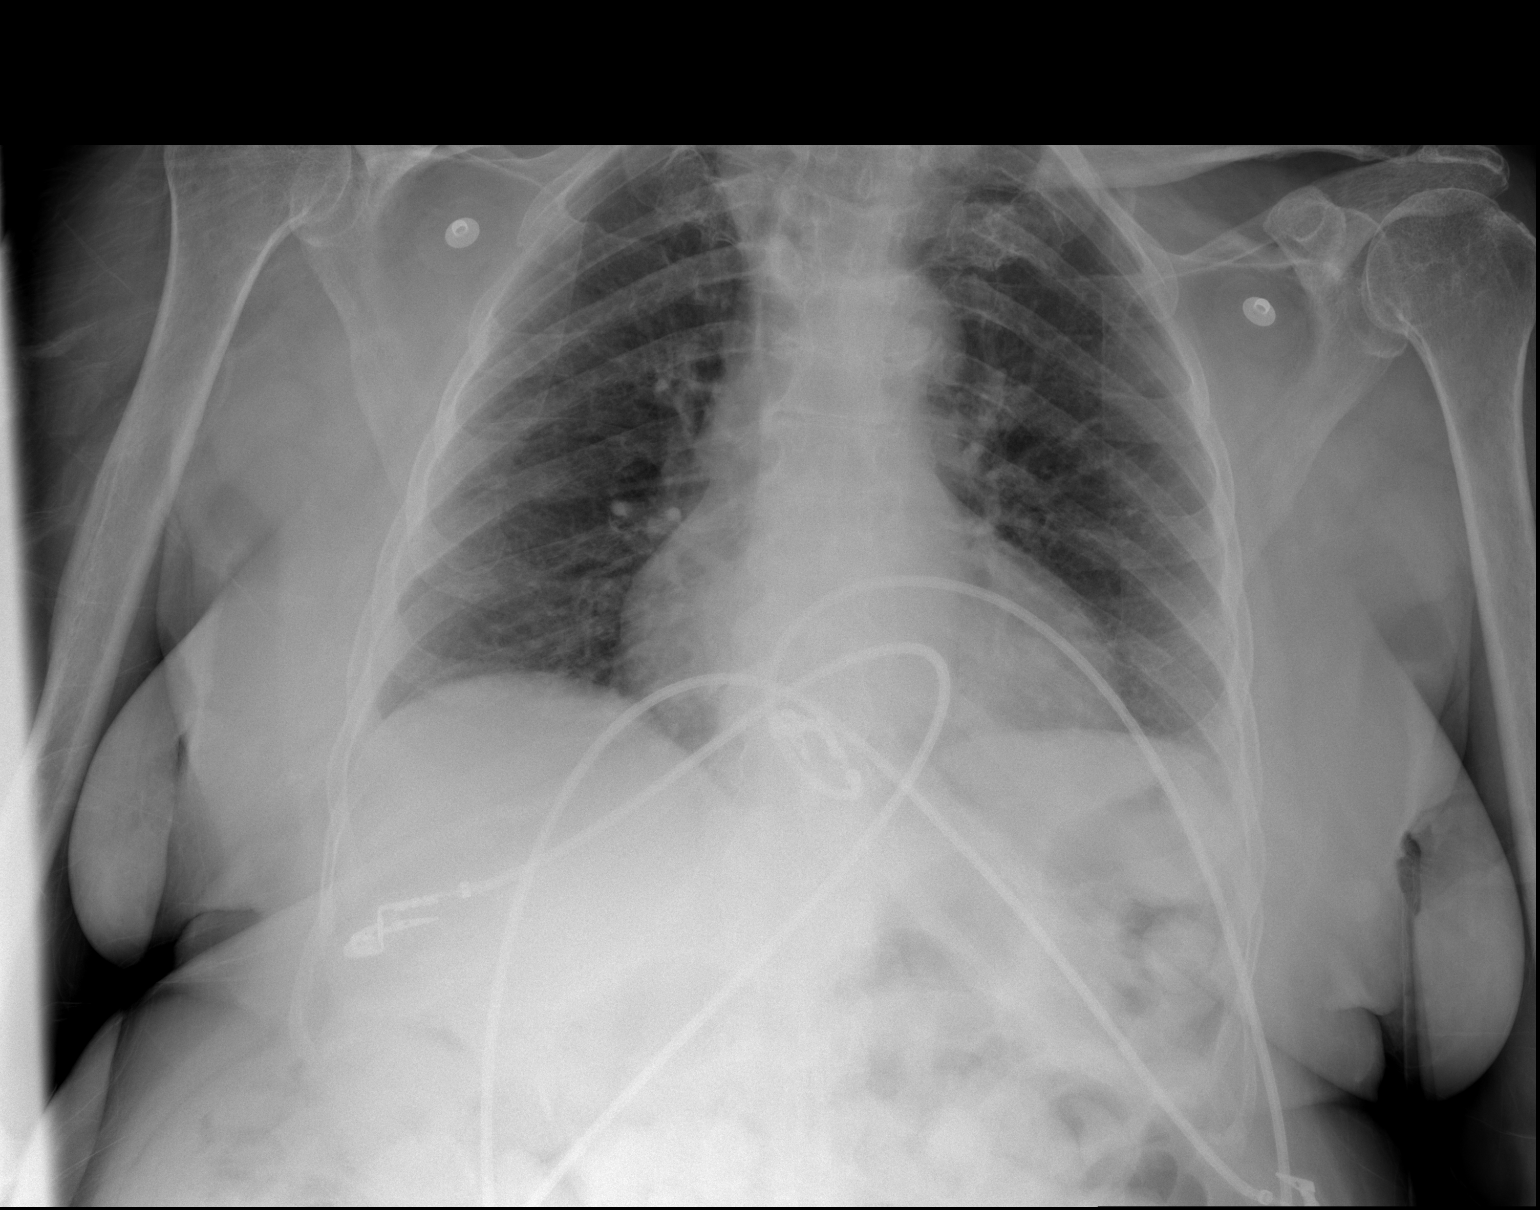
[im 2/2]
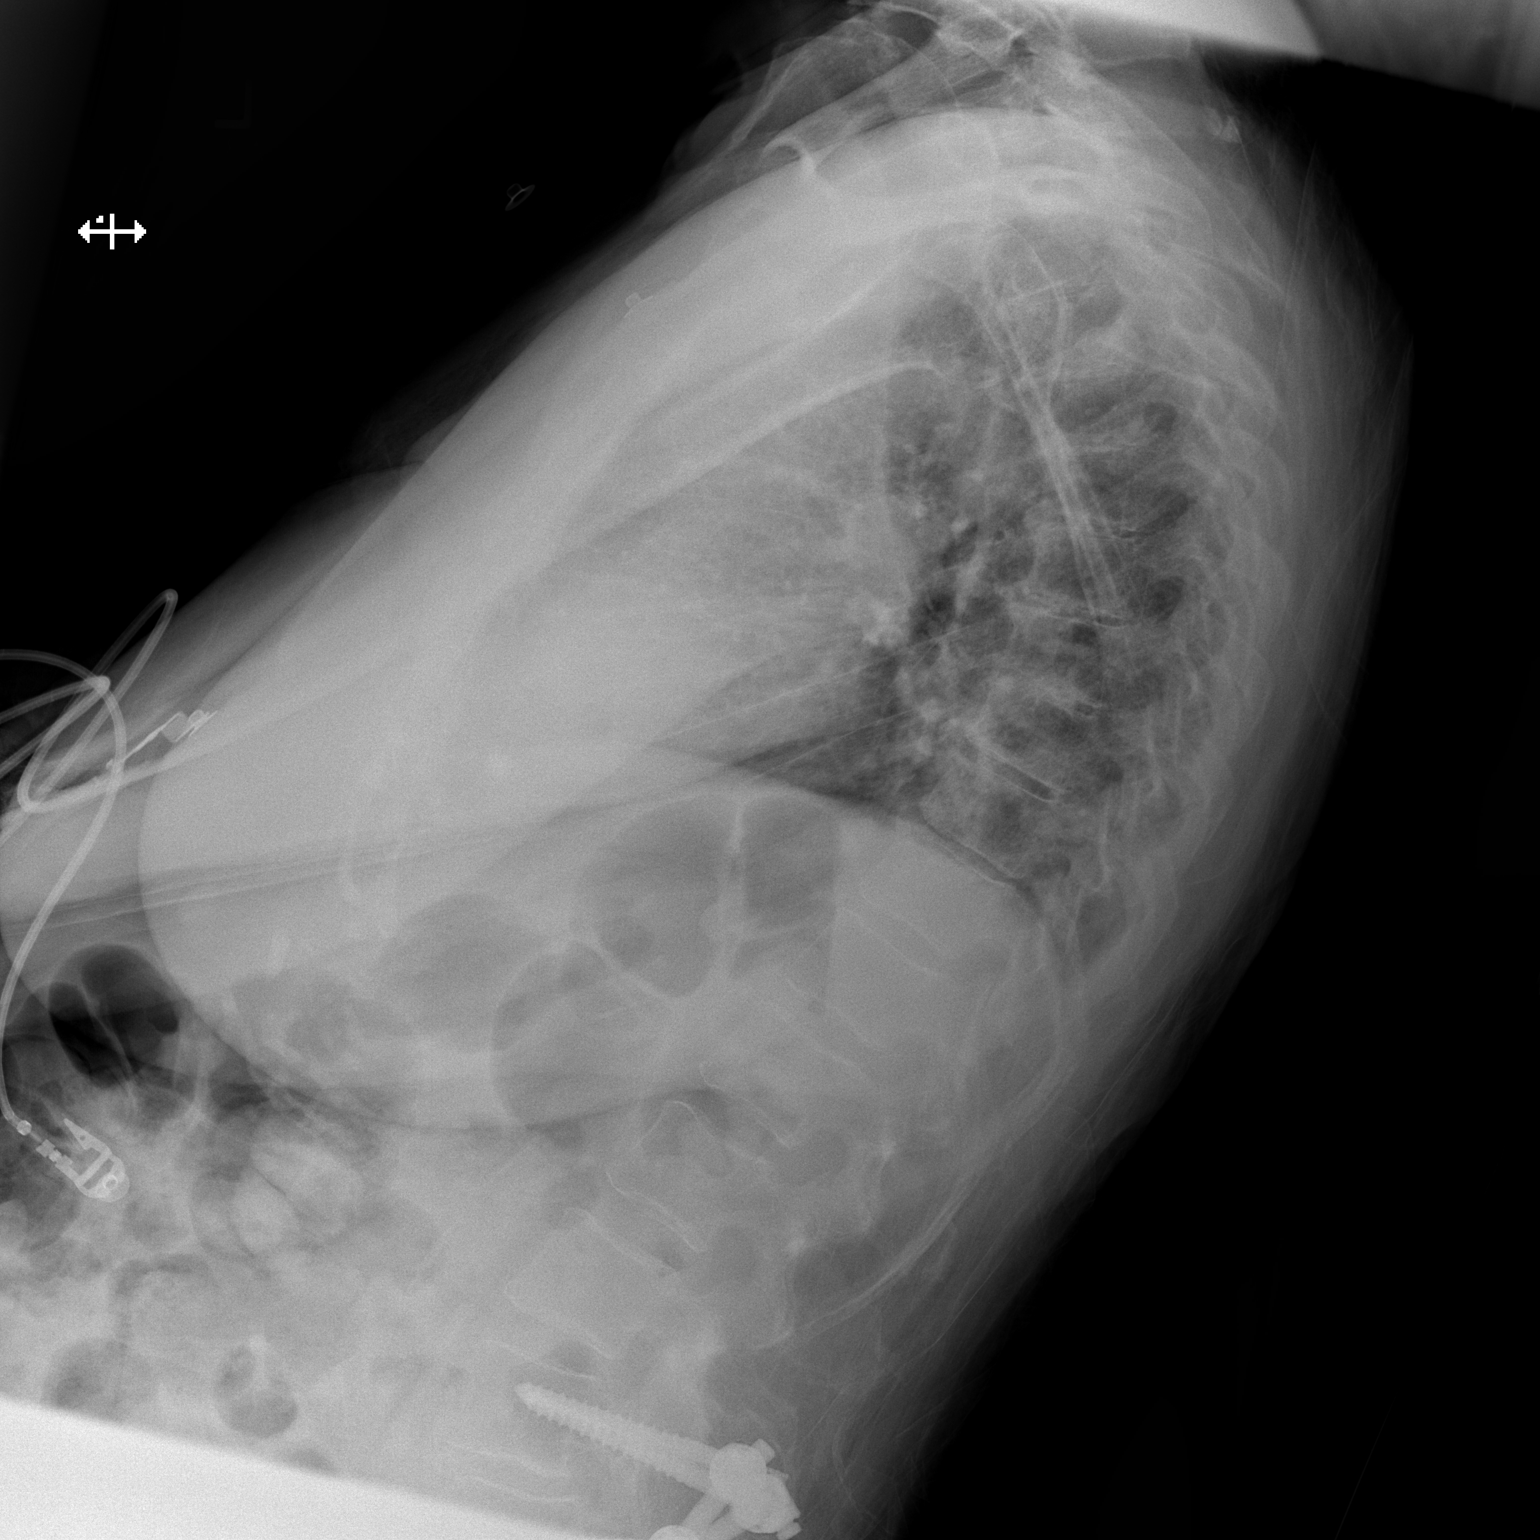

[2 of 2 positions shown; findings below may reference images not displayed]

PROCEDURE:     DXR - DXR CHEST PA (OR AP) AND LATERAL  - August 24, 2011  [DATE]

RESULT:      Comparison is made to a prior study dated 07/17/2011.

Area of increased density projects within the retrocardiac region on the
left. The cardiac silhouette is within normal limits. The visualized bony
skeleton is unremarkable. The patient has taken a shallow inspiration.
IMPRESSION: Atelectasis versus infiltrate left lower lobe.

## 2012-12-18 IMAGING — CT CT HEAD WITHOUT CONTRAST
3 series · 17 of 30 positions shown, 19 images · non-contrast
Comparison: none

REASON FOR EXAM: ams
COMMENTS:   May transport without cardiac monitor

PROCEDURE:     CT  - CT HEAD WITHOUT CONTRAST  - August 24, 2011  [DATE]
RESULT:     Head CT dated 08/24/2011. Comparison made to prior study dated
08/10/2011.
TECHNIQUE: Helical 5 mm sections to the skull base to vertex without
administration of intravenous contrast.

[Series 2: without · axial · non-contrast · 0.46mm/px · z∈[+487,+592]mm · 8 of 29 slices shown (1 of 2)]
[im 4/29  brain]
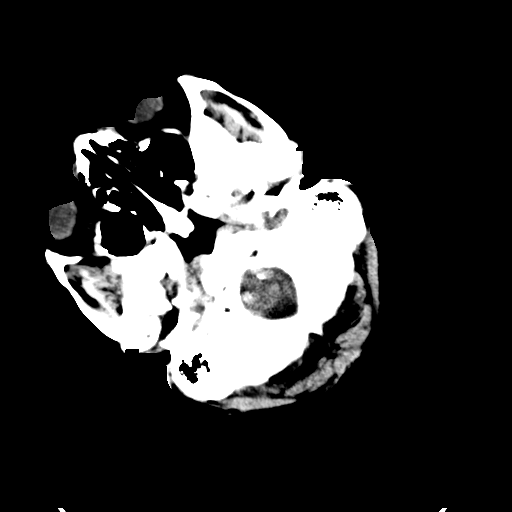
[im 7/29  brain]
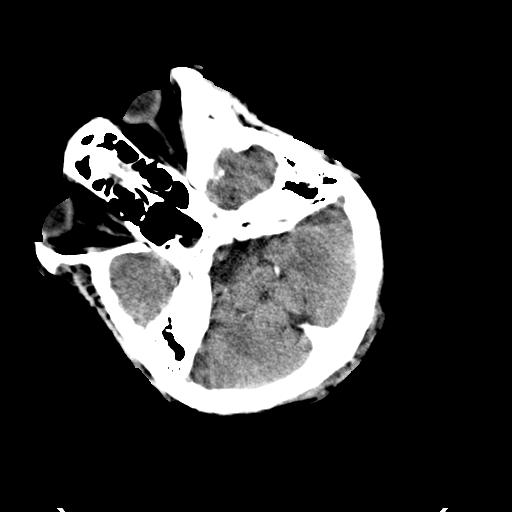
[im 10/29  brain]
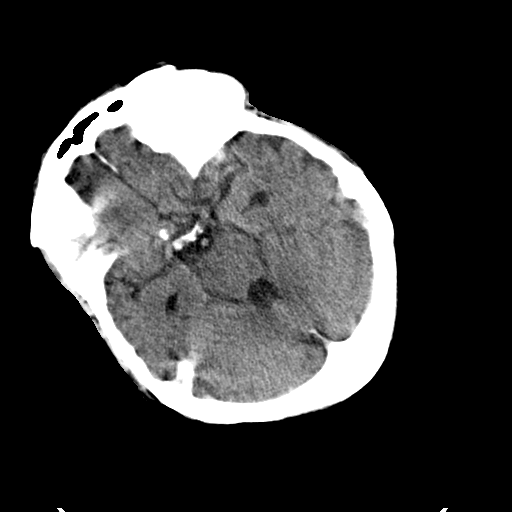
[im 13/29  brain]
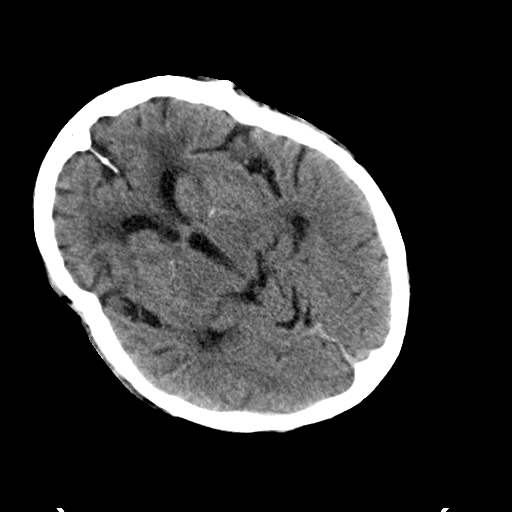
[im 16/29  brain]
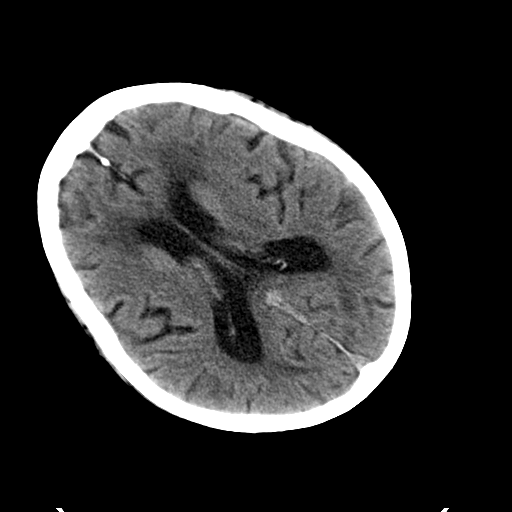
[im 19/29  brain]
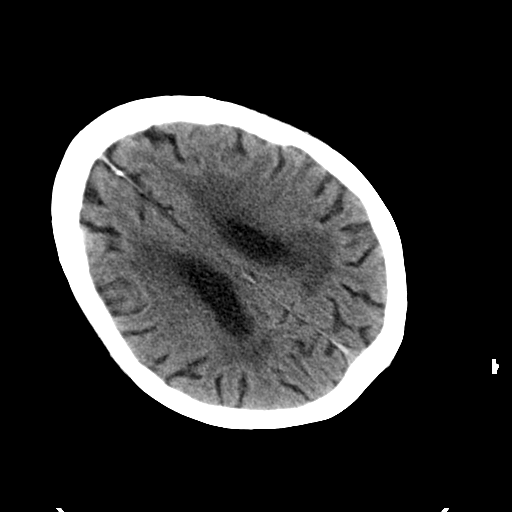
[im 22/29  brain]
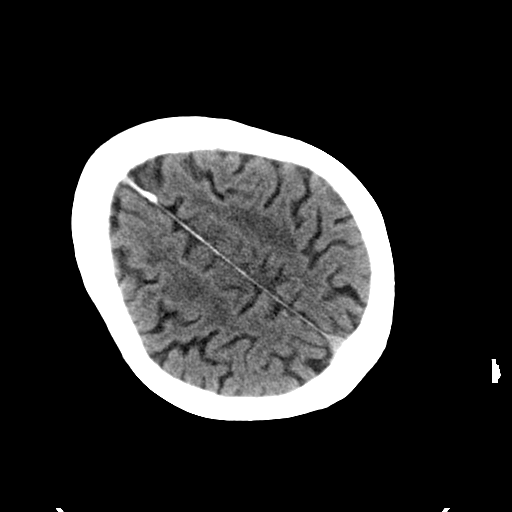
[im 25/29  brain]
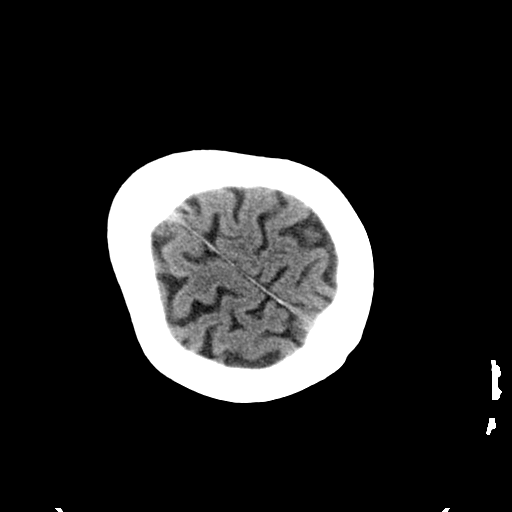

[Series 3: bone · axial · 0.46mm/px · 1 of 29 slices shown]
[im 4/29  bone]
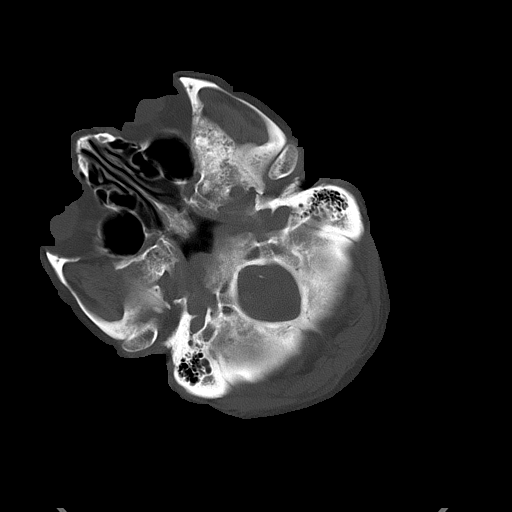

[Series 4: without · axial · non-contrast · 0.46mm/px · z∈[+494,+599]mm · 8 of 28 slices shown, 10 images (2 of 2)]
[im 4/28  brain]
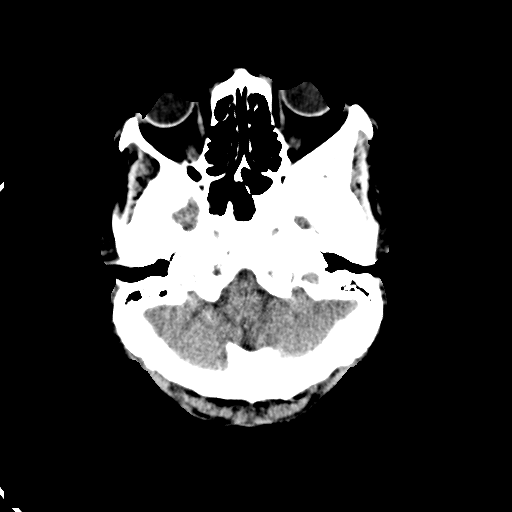
[im 4/28  bone]
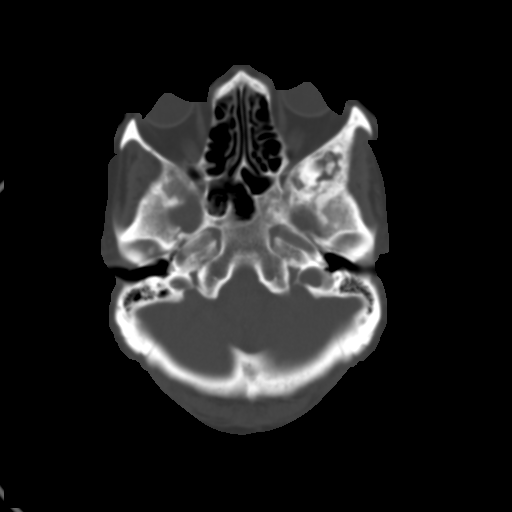
[im 7/28  brain]
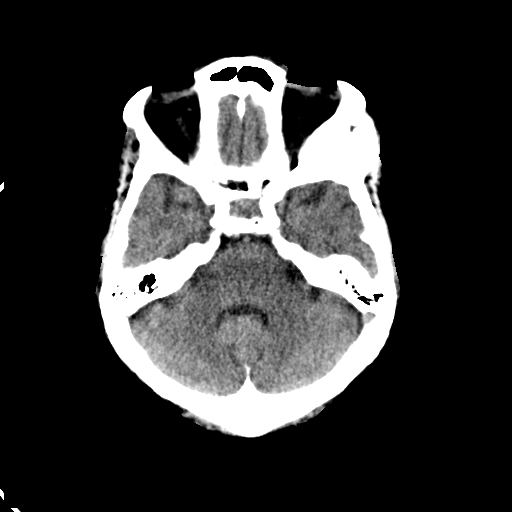
[im 10/28  brain]
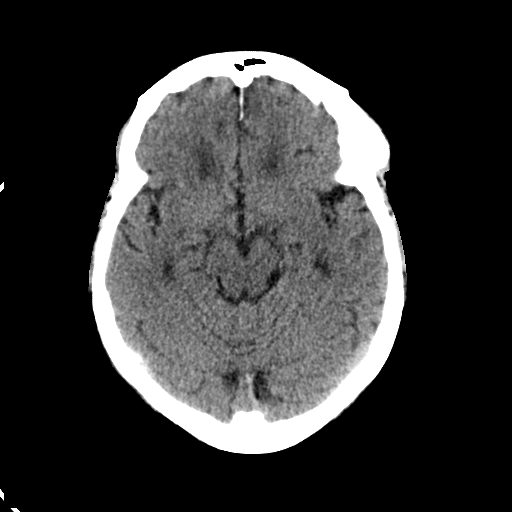
[im 13/28  brain]
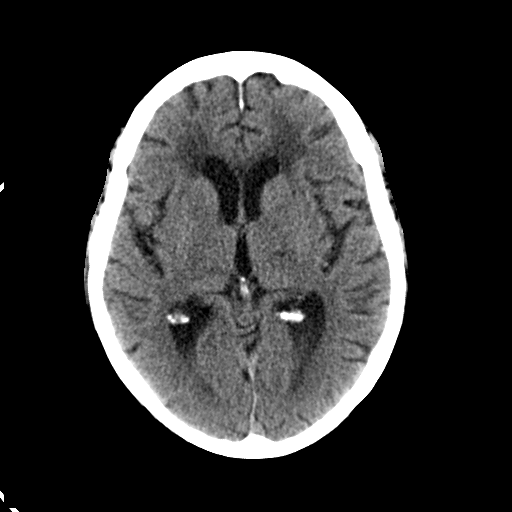
[im 16/28  brain]
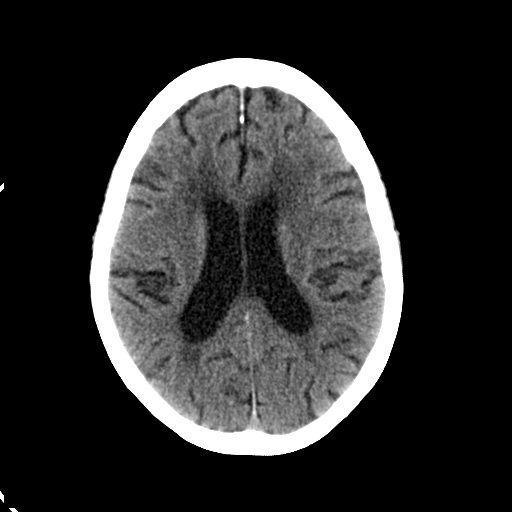
[im 16/28  bone]
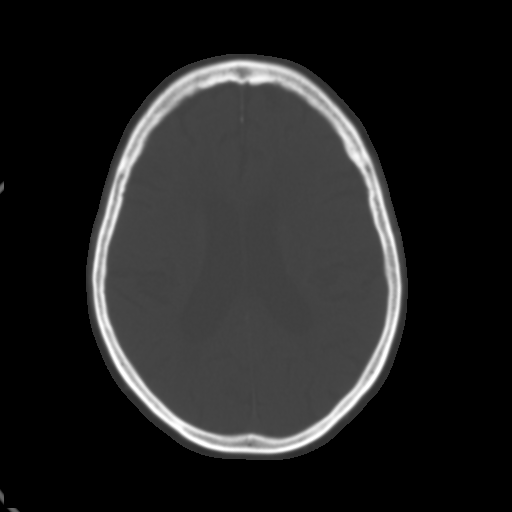
[im 19/28  brain]
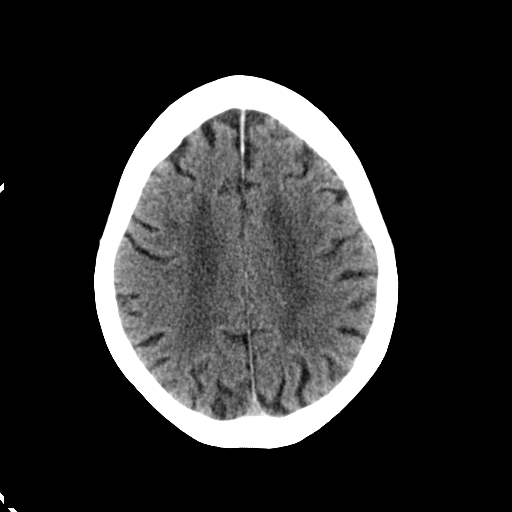
[im 22/28  brain]
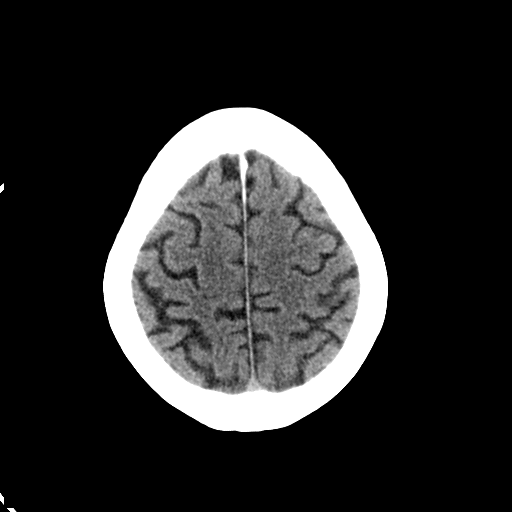
[im 25/28  brain]
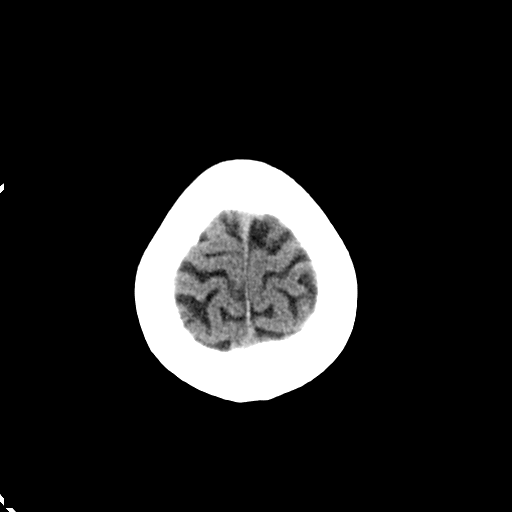

[17 of 30 positions shown; findings below may reference images not displayed]

FINDINGS: Diffuse areas of low attenuation project within the subcortical,
deep, and periventricular white matter regions. There is mild diffuse
cortical atrophy. There is no evidence of intra-axial nor extra-axial fluid
collections no evidence of acute hemorrhage. Basal ganglial calcifications
are identified. There is no evidence of mass effect nor a depressed skull
fracture. The paranasal sinuses are patent and the mastoid air cells are
pneumatized.
IMPRESSION: Involutional changes without evidence of acute
abnormalities.

## 2013-01-18 ENCOUNTER — Emergency Department (HOSPITAL_COMMUNITY): Payer: Medicare Other

## 2013-01-18 ENCOUNTER — Inpatient Hospital Stay (HOSPITAL_COMMUNITY)
Admission: EM | Admit: 2013-01-18 | Discharge: 2013-01-19 | DRG: 203 | Disposition: A | Discharge status: Skilled Nursing Facility | Payer: Medicare Other | Attending: Internal Medicine | Admitting: Internal Medicine

## 2013-01-18 ENCOUNTER — Encounter (HOSPITAL_COMMUNITY): Payer: Self-pay

## 2013-01-18 DIAGNOSIS — F3289 Other specified depressive episodes: Secondary | ICD-10-CM | POA: Diagnosis present

## 2013-01-18 DIAGNOSIS — Z8673 Personal history of transient ischemic attack (TIA), and cerebral infarction without residual deficits: Secondary | ICD-10-CM

## 2013-01-18 DIAGNOSIS — I10 Essential (primary) hypertension: Secondary | ICD-10-CM | POA: Diagnosis present

## 2013-01-18 DIAGNOSIS — Z7982 Long term (current) use of aspirin: Secondary | ICD-10-CM

## 2013-01-18 DIAGNOSIS — N189 Chronic kidney disease, unspecified: Secondary | ICD-10-CM | POA: Diagnosis present

## 2013-01-18 DIAGNOSIS — E119 Type 2 diabetes mellitus without complications: Secondary | ICD-10-CM | POA: Diagnosis present

## 2013-01-18 DIAGNOSIS — I129 Hypertensive chronic kidney disease with stage 1 through stage 4 chronic kidney disease, or unspecified chronic kidney disease: Secondary | ICD-10-CM | POA: Diagnosis present

## 2013-01-18 DIAGNOSIS — M109 Gout, unspecified: Secondary | ICD-10-CM | POA: Diagnosis present

## 2013-01-18 DIAGNOSIS — K219 Gastro-esophageal reflux disease without esophagitis: Secondary | ICD-10-CM | POA: Diagnosis present

## 2013-01-18 DIAGNOSIS — Z9849 Cataract extraction status, unspecified eye: Secondary | ICD-10-CM

## 2013-01-18 DIAGNOSIS — G309 Alzheimer's disease, unspecified: Secondary | ICD-10-CM | POA: Diagnosis present

## 2013-01-18 DIAGNOSIS — J9801 Acute bronchospasm: Secondary | ICD-10-CM

## 2013-01-18 DIAGNOSIS — Z66 Do not resuscitate: Secondary | ICD-10-CM | POA: Diagnosis present

## 2013-01-18 DIAGNOSIS — J209 Acute bronchitis, unspecified: Principal | ICD-10-CM | POA: Diagnosis present

## 2013-01-18 DIAGNOSIS — N183 Chronic kidney disease, stage 3 unspecified: Secondary | ICD-10-CM | POA: Diagnosis present

## 2013-01-18 DIAGNOSIS — N318 Other neuromuscular dysfunction of bladder: Secondary | ICD-10-CM | POA: Diagnosis present

## 2013-01-18 DIAGNOSIS — F329 Major depressive disorder, single episode, unspecified: Secondary | ICD-10-CM | POA: Diagnosis present

## 2013-01-18 DIAGNOSIS — Z9089 Acquired absence of other organs: Secondary | ICD-10-CM

## 2013-01-18 DIAGNOSIS — E785 Hyperlipidemia, unspecified: Secondary | ICD-10-CM | POA: Diagnosis present

## 2013-01-18 DIAGNOSIS — F0280 Dementia in other diseases classified elsewhere without behavioral disturbance: Secondary | ICD-10-CM | POA: Diagnosis present

## 2013-01-18 DIAGNOSIS — L408 Other psoriasis: Secondary | ICD-10-CM | POA: Diagnosis present

## 2013-01-18 DIAGNOSIS — Z79899 Other long term (current) drug therapy: Secondary | ICD-10-CM

## 2013-01-18 HISTORY — DX: Dementia in other diseases classified elsewhere, unspecified severity, without behavioral disturbance, psychotic disturbance, mood disturbance, and anxiety: F02.80

## 2013-01-18 HISTORY — DX: Alzheimer's disease, unspecified: G30.9

## 2013-01-18 LAB — CBC WITH DIFFERENTIAL/PLATELET
Basophils Absolute: 0 10*3/uL (ref 0.0–0.1)
Basophils Relative: 0 % (ref 0–1)
Eosinophils Absolute: 1.2 10*3/uL — ABNORMAL HIGH (ref 0.0–0.7)
Eosinophils Relative: 13 % — ABNORMAL HIGH (ref 0–5)
HCT: 33.8 % — ABNORMAL LOW (ref 36.0–46.0)
Hemoglobin: 10.6 g/dL — ABNORMAL LOW (ref 12.0–15.0)
Lymphocytes Relative: 13 % (ref 12–46)
Lymphs Abs: 1.2 10*3/uL (ref 0.7–4.0)
MCH: 26.4 pg (ref 26.0–34.0)
MCHC: 31.4 g/dL (ref 30.0–36.0)
MCV: 84.3 fL (ref 78.0–100.0)
Monocytes Absolute: 0.7 10*3/uL (ref 0.1–1.0)
Monocytes Relative: 7 % (ref 3–12)
Neutro Abs: 6.3 10*3/uL (ref 1.7–7.7)
Neutrophils Relative %: 67 % (ref 43–77)
Platelets: 199 10*3/uL (ref 150–400)
RBC: 4.01 MIL/uL (ref 3.87–5.11)
RDW: 14 % (ref 11.5–15.5)
WBC: 9.5 10*3/uL (ref 4.0–10.5)

## 2013-01-18 LAB — BASIC METABOLIC PANEL
BUN: 22 mg/dL (ref 6–23)
CO2: 22 mEq/L (ref 19–32)
Calcium: 9.8 mg/dL (ref 8.4–10.5)
Chloride: 103 mEq/L (ref 96–112)
Creatinine, Ser: 1.13 mg/dL — ABNORMAL HIGH (ref 0.50–1.10)
GFR calc Af Amer: 49 mL/min — ABNORMAL LOW (ref >90)
GFR calc non Af Amer: 42 mL/min — ABNORMAL LOW (ref >90)
Glucose, Bld: 117 mg/dL — ABNORMAL HIGH (ref 70–99)
Potassium: 3.6 mEq/L (ref 3.5–5.1)
Sodium: 136 mEq/L (ref 135–145)

## 2013-01-18 LAB — GLUCOSE, CAPILLARY: Glucose-Capillary: 125 mg/dL — ABNORMAL HIGH (ref 70–99)

## 2013-01-18 LAB — TROPONIN I
Troponin I: <0.3 ng/mL (ref <0.30)
Troponin I: <0.3 ng/mL (ref <0.30)

## 2013-01-18 LAB — PRO B NATRIURETIC PEPTIDE: Pro B Natriuretic peptide (BNP): 180.9 pg/mL (ref 0–450)

## 2013-01-18 LAB — HEMOGLOBIN A1C
Hgb A1c MFr Bld: 5.9 % — ABNORMAL HIGH (ref <5.7)
Mean Plasma Glucose: 123 mg/dL — ABNORMAL HIGH (ref <117)

## 2013-01-18 LAB — MRSA PCR SCREENING: MRSA by PCR: POSITIVE — AB

## 2013-01-18 MED ORDER — MUPIROCIN 2 % EX OINT
1.0000 "application " | TOPICAL_OINTMENT | Freq: Two times a day (BID) | CUTANEOUS | Status: DC
  Administered 2013-01-18 – 2013-01-19 (×3): 1 via NASAL
  Filled 2013-01-18: qty 22

## 2013-01-18 MED ORDER — ASPIRIN EC 81 MG PO TBEC
81.0000 mg | DELAYED_RELEASE_TABLET | Freq: Every day | ORAL | Status: DC
  Administered 2013-01-18 – 2013-01-19 (×2): 81 mg via ORAL
  Filled 2013-01-18 (×4): qty 1

## 2013-01-18 MED ORDER — CLONIDINE HCL 0.3 MG/24HR TD PTWK: 0.3000 mg | MEDICATED_PATCH | TRANSDERMAL | Status: DC

## 2013-01-18 MED ORDER — ASPIRIN 81 MG PO TABS: 81.0000 mg | ORAL_TABLET | Freq: Every morning | ORAL | Status: DC

## 2013-01-18 MED ORDER — SODIUM CHLORIDE 0.9 % IV SOLN
INTRAVENOUS | Status: DC
  Administered 2013-01-18 – 2013-01-19 (×2): via INTRAVENOUS

## 2013-01-18 MED ORDER — HEPARIN SODIUM (PORCINE) 5000 UNIT/ML IJ SOLN
5000.0000 [IU] | Freq: Three times a day (TID) | INTRAMUSCULAR | Status: DC
  Administered 2013-01-18 – 2013-01-19 (×3): 5000 [IU] via SUBCUTANEOUS
  Filled 2013-01-18 (×10): qty 1

## 2013-01-18 MED ORDER — PANTOPRAZOLE SODIUM 40 MG PO TBEC
40.0000 mg | DELAYED_RELEASE_TABLET | Freq: Every day | ORAL | Status: DC
  Administered 2013-01-18 – 2013-01-19 (×2): 40 mg via ORAL
  Filled 2013-01-18 (×3): qty 1

## 2013-01-18 MED ORDER — PREDNISONE 20 MG PO TABS
60.0000 mg | ORAL_TABLET | Freq: Once | ORAL | Status: AC
  Administered 2013-01-18: 60 mg via ORAL
  Filled 2013-01-18: qty 3

## 2013-01-18 MED ORDER — CHLORHEXIDINE GLUCONATE CLOTH 2 % EX PADS
6.0000 | MEDICATED_PAD | Freq: Every day | CUTANEOUS | Status: DC
  Administered 2013-01-19: 6 via TOPICAL

## 2013-01-18 MED ORDER — SODIUM CHLORIDE 0.9 % IJ SOLN: 3.0000 mL | INTRAMUSCULAR | Status: DC | PRN

## 2013-01-18 MED ORDER — POLYETHYLENE GLYCOL 3350 17 G PO PACK
17.0000 g | PACK | Freq: Every morning | ORAL | Status: DC
  Administered 2013-01-18 – 2013-01-19 (×2): 17 g via ORAL
  Filled 2013-01-18 (×3): qty 1

## 2013-01-18 MED ORDER — ALLOPURINOL 100 MG PO TABS
100.0000 mg | ORAL_TABLET | Freq: Every morning | ORAL | Status: DC
  Administered 2013-01-18 – 2013-01-19 (×2): 100 mg via ORAL
  Filled 2013-01-18 (×4): qty 1

## 2013-01-18 MED ORDER — ALBUTEROL (5 MG/ML) CONTINUOUS INHALATION SOLN
10.0000 mg/h | INHALATION_SOLUTION | RESPIRATORY_TRACT | Status: DC
  Administered 2013-01-18: 10 mg/h via RESPIRATORY_TRACT
  Filled 2013-01-18: qty 20

## 2013-01-18 MED ORDER — DOCUSATE SODIUM 100 MG PO CAPS
100.0000 mg | ORAL_CAPSULE | Freq: Every day | ORAL | Status: DC
  Administered 2013-01-18 – 2013-01-19 (×2): 100 mg via ORAL
  Filled 2013-01-18 (×3): qty 1

## 2013-01-18 MED ORDER — METHYLPREDNISOLONE SODIUM SUCC 40 MG IJ SOLR
40.0000 mg | Freq: Four times a day (QID) | INTRAMUSCULAR | Status: DC
  Administered 2013-01-18 – 2013-01-19 (×4): 40 mg via INTRAVENOUS
  Filled 2013-01-18 (×9): qty 1

## 2013-01-18 MED ORDER — ONDANSETRON HCL 4 MG PO TABS: 4.0000 mg | ORAL_TABLET | Freq: Four times a day (QID) | ORAL | Status: DC | PRN

## 2013-01-18 MED ORDER — ACETAMINOPHEN 325 MG PO TABS: 650.0000 mg | ORAL_TABLET | Freq: Four times a day (QID) | ORAL | Status: DC | PRN

## 2013-01-18 MED ORDER — DEXTROSE 5 % IV SOLN
500.0000 mg | INTRAVENOUS | Status: DC
  Administered 2013-01-18: 500 mg via INTRAVENOUS
  Filled 2013-01-18 (×2): qty 500

## 2013-01-18 MED ORDER — LORAZEPAM 0.5 MG PO TABS
0.5000 mg | ORAL_TABLET | Freq: Two times a day (BID) | ORAL | Status: DC
  Administered 2013-01-18 – 2013-01-19 (×3): 0.5 mg via ORAL
  Filled 2013-01-18 (×3): qty 1

## 2013-01-18 MED ORDER — ALBUTEROL SULFATE (5 MG/ML) 0.5% IN NEBU
2.5000 mg | INHALATION_SOLUTION | RESPIRATORY_TRACT | Status: DC | PRN
  Administered 2013-01-18: 2.5 mg via RESPIRATORY_TRACT

## 2013-01-18 MED ORDER — ONDANSETRON HCL 4 MG/2ML IJ SOLN: 4.0000 mg | Freq: Four times a day (QID) | INTRAMUSCULAR | Status: DC | PRN

## 2013-01-18 MED ORDER — DEXTROSE 5 % IV SOLN
1.0000 g | INTRAVENOUS | Status: DC
  Administered 2013-01-18: 1 g via INTRAVENOUS
  Filled 2013-01-18 (×2): qty 10

## 2013-01-18 MED ORDER — OXYCODONE HCL 5 MG PO TABS: 5.0000 mg | ORAL_TABLET | ORAL | Status: DC | PRN

## 2013-01-18 MED ORDER — INSULIN ASPART 100 UNIT/ML ~~LOC~~ SOLN
0.0000 [IU] | Freq: Three times a day (TID) | SUBCUTANEOUS | Status: DC
  Administered 2013-01-18 – 2013-01-19 (×3): 1 [IU] via SUBCUTANEOUS

## 2013-01-18 MED ORDER — SODIUM CHLORIDE 0.9 % IJ SOLN
3.0000 mL | Freq: Two times a day (BID) | INTRAMUSCULAR | Status: DC
  Administered 2013-01-18 (×2): 3 mL via INTRAVENOUS

## 2013-01-18 MED ORDER — SODIUM CHLORIDE 0.9 % IV SOLN: 250.0000 mL | INTRAVENOUS | Status: DC | PRN

## 2013-01-18 MED ORDER — IPRATROPIUM BROMIDE 0.02 % IN SOLN
0.5000 mg | Freq: Once | RESPIRATORY_TRACT | Status: AC
  Administered 2013-01-18: 0.5 mg via RESPIRATORY_TRACT
  Filled 2013-01-18: qty 2.5

## 2013-01-18 MED ORDER — TRIAMCINOLONE ACETONIDE 0.5 % EX CREA: 1.0000 "application " | TOPICAL_CREAM | Freq: Three times a day (TID) | CUTANEOUS | Status: DC

## 2013-01-18 MED ORDER — ACETAMINOPHEN 650 MG RE SUPP: 650.0000 mg | Freq: Four times a day (QID) | RECTAL | Status: DC | PRN

## 2013-01-18 MED ORDER — IPRATROPIUM BROMIDE 0.02 % IN SOLN
0.5000 mg | RESPIRATORY_TRACT | Status: DC
  Administered 2013-01-18 (×3): 0.5 mg via RESPIRATORY_TRACT
  Filled 2013-01-18 (×5): qty 2.5

## 2013-01-18 MED ORDER — ALBUTEROL SULFATE (5 MG/ML) 0.5% IN NEBU
2.5000 mg | INHALATION_SOLUTION | RESPIRATORY_TRACT | Status: DC
  Administered 2013-01-18 (×3): 2.5 mg via RESPIRATORY_TRACT
  Filled 2013-01-18 (×4): qty 0.5

## 2013-01-18 MED ORDER — SODIUM CHLORIDE 0.9 % IJ SOLN: 3.0000 mL | Freq: Two times a day (BID) | INTRAMUSCULAR | Status: DC

## 2013-01-18 MED ORDER — CRANBERRY 200 MG PO CAPS: 400.0000 mg | ORAL_CAPSULE | Freq: Every morning | ORAL | Status: DC

## 2013-01-18 MED ORDER — DOCUSATE SODIUM 60 MG/15ML PO SYRP: 40.0000 mg | ORAL_SOLUTION | Freq: Two times a day (BID) | ORAL | Status: DC

## 2013-01-18 MED ORDER — LABETALOL HCL 200 MG PO TABS
200.0000 mg | ORAL_TABLET | Freq: Three times a day (TID) | ORAL | Status: DC
  Administered 2013-01-18 – 2013-01-19 (×4): 200 mg via ORAL
  Filled 2013-01-18 (×7): qty 1

## 2013-01-18 MED ORDER — MORPHINE SULFATE 2 MG/ML IJ SOLN: 2.0000 mg | INTRAMUSCULAR | Status: DC | PRN

## 2013-01-18 MED ORDER — ALBUTEROL SULFATE (5 MG/ML) 0.5% IN NEBU
2.5000 mg | INHALATION_SOLUTION | RESPIRATORY_TRACT | Status: DC | PRN
  Filled 2013-01-18: qty 0.5

## 2013-01-18 MED ORDER — HYDRALAZINE HCL 50 MG PO TABS
75.0000 mg | ORAL_TABLET | Freq: Every day | ORAL | Status: DC
  Administered 2013-01-18 – 2013-01-19 (×2): 75 mg via ORAL
  Filled 2013-01-18 (×3): qty 1

## 2013-01-18 MED ORDER — OXYBUTYNIN CHLORIDE ER 5 MG PO TB24
5.0000 mg | ORAL_TABLET | Freq: Every morning | ORAL | Status: DC
  Administered 2013-01-18 – 2013-01-19 (×2): 5 mg via ORAL
  Filled 2013-01-18 (×3): qty 1

## 2013-01-18 MED ORDER — TRIAMCINOLONE ACETONIDE 0.1 % EX CREA
1.0000 "application " | TOPICAL_CREAM | Freq: Every day | CUTANEOUS | Status: DC
  Administered 2013-01-18: 1 via TOPICAL
  Filled 2013-01-18: qty 15

## 2013-01-18 MED ORDER — AMLODIPINE BESYLATE 10 MG PO TABS
10.0000 mg | ORAL_TABLET | Freq: Every morning | ORAL | Status: DC
  Administered 2013-01-18 – 2013-01-19 (×2): 10 mg via ORAL
  Filled 2013-01-18 (×4): qty 1
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
## 2013-01-18 NOTE — Progress Notes (Signed)
PHARMACIST - PHYSICIAN ORDER COMMUNICATION  CONCERNING: P&T Medication Policy on Herbal Medications  DESCRIPTION:  This patient's order for:  Cranberry  has been noted.  This product(s) is classified as an "herbal" or natural product. Due to a lack of definitive safety studies or FDA approval, nonstandard manufacturing practices, plus the potential risk of unknown drug-drug interactions while on inpatient medications, the Pharmacy and Therapeutics Committee does not permit the use of "herbal" or natural products of this type within [facility].   ACTION TAKEN: The pharmacy department is unable to verify this order at this time and your patient has been informed of this safety policy. Please reevaluate patient's clinical condition at discharge and address if the herbal or natural product(s) should be resumed at that time.   

## 2013-01-18 NOTE — H&P (Signed)
PCP:   Doran Durand, MD   Chief Complaint:  SOB.   HPI: This is an 79 year old female resident of Memory Unit at [facility], with known history of Alzheimer's dementia, depression, HTN, s/p CVA, dyslipidemia, GERD, Gout, stage III CKD (baseline creatinine around 2), DM-2, overactive bladder, Psoriasis, s/p cholecystectomy, s/p TAH, s/p cataract surgery, s/p back surgery, presenting with progressive SOB/Wheeze. History is supplied by son and daughter in-law, as patient was unable to contribute in any meaningful way. About 2 weeks ago,, she had been diagnosed with pneumonia at SNF, and treated with a course of Levaquin, nebs and steroids, after which she improved, although she has had a residual cough. Over the last 4 days, however, patient has become progressively SOB, with worsening of cough, which is non-productive,. She has had no fever or chest pain. Family went to visit on 01/17/13, and stayed till 9:00 PM, then after they had gone home, they received a call at about 2:00 AM on 01/18/13, and were informed that patient was now worse, and was being taken to the ED.    Allergies:  No Known Allergies    Past Medical History  Diagnosis Date  . Stroke   . Dementia   . HTN (hypertension)   . Hyperlipidemia   . GERD (gastroesophageal reflux disease)   . Gout   . Depression   . Renal failure   . Alzheimer's dementia     Past Surgical History  Procedure Laterality Date  . Cholecystectomy    . Total abdominal hysterectomy    . Cataract extraction w/ intraocular lens implant    . Laminectomy      Prior to Admission medications   Medication Sig Start Date End Date Taking? Authorizing Provider  acetaminophen (MAPAP) 500 MG tablet Take 1,000 mg by mouth every 8 (eight) hours as needed for pain.    Yes Historical Provider, MD  allopurinol (ZYLOPRIM) 100 MG tablet Take 100 mg by mouth every morning.    Yes Historical Provider, MD  amLODipine (NORVASC) 10 MG tablet Take 10 mg by mouth  every morning.    Yes Historical Provider, MD  aspirin 81 MG tablet Take 81 mg by mouth every morning.    Yes Historical Provider, MD  cloNIDine (CATAPRES - DOSED IN MG/24 HR) 0.3 mg/24hr Place 1 patch (0.3 mg total) onto the skin every 7 (seven) days. 10/29/12  Yes Maryruth Bun Rama, MD  Cranberry 200 MG CAPS Take 400 mg by mouth every morning.   Yes Historical Provider, MD  docusate (COLACE) 60 MG/15ML syrup Take 40 mg by mouth 2 (two) times daily.   Yes Historical Provider, MD  guaifenesin (ROBAFEN) 100 MG/5ML syrup Take 100 mg by mouth every 4 (four) hours as needed for cough.   Yes Historical Provider, MD  hydrALAZINE (APRESOLINE) 50 MG tablet Take 75 mg by mouth daily. Takes one and one half tablets to make 75mg    Yes Historical Provider, MD  HYDROcodone-acetaminophen (NORCO/VICODIN) 5-325 MG per tablet Take 1-2 tablets by mouth every 6 (six) hours as needed for pain (pain).   Yes Historical Provider, MD  ipratropium-albuterol (DUONEB) 0.5-2.5 (3) MG/3ML SOLN Take 3 mLs by nebulization 2 (two) times daily.    Yes Historical Provider, MD  labetalol (NORMODYNE) 200 MG tablet Take 1 tablet (200 mg total) by mouth 3 (three) times daily. 10/24/12  Yes Christina P Rama, MD  LORazepam (ATIVAN) 0.5 MG tablet Take 0.5 mg by mouth 2 (two) times daily.   Yes  Historical Provider, MD  omeprazole (PRILOSEC) 40 MG capsule Take 40 mg by mouth every morning.    Yes Historical Provider, MD  oxybutynin (DITROPAN-XL) 5 MG 24 hr tablet Take 5 mg by mouth every morning.   Yes Historical Provider, MD  polyethylene glycol (MIRALAX / GLYCOLAX) packet Take 17 g by mouth every morning.   Yes Historical Provider, MD  triamcinolone cream (KENALOG) 0.5 % Apply 1 application topically 3 (three) times daily. Applies to rash on forearm, upper back and arms in the morning and then to upper back and arms in the evening.   Yes Historical Provider, MD  ZINC OXIDE EX Apply 1 application topically 2 (two) times daily. Applies to skin  breakdown on bottom.   Yes Historical Provider, MD    Social History: Patient reports that she has never smoked. She has never used smokeless tobacco. She reports that she does not drink alcohol or use illicit drugs.  Family History  Problem Relation Age of Onset  . Hypertension Mother   . Hypertension Father     Review of Systems:  As per HPI and chief complaint. Unobtainable, but family states that patient has had no vomiting, abdominal pain, fever, chest pain, Patent denies fatigue, diminished appetite, weight loss, fever, chills, headache, blurred vision, difficulty in speaking, dyspahgia, chest pain, hematemesis, hematochezia, lower extremity swelling, pain, or redness.  Physical Exam:  General:  Patient is audibly wheezy, with occasional "fruity" cough. Alert, following simple commands, mildly short of breath at rest.  Skin: "Burnt out" psoriatic lesions on skin of arms and legs.  HEENT:  Mild clinical pallor, no jaundice, no conjunctival injection or discharge. NECK:  Supple, JVP not seen, no carotid bruits, no palpable lymphadenopathy, no palpable goiter. CHEST:  Bilateral expiratory wheezes, no crackles. HEART:  Sounds 1 and 2 heard, normal, regular, no murmurs. ABDOMEN:  Softly distended, non-tender, no palpable organomegaly, no palpable masses, normal bowel sounds. GENITALIA:  Not examined. LOWER EXTREMITIES:  No pitting edema, palpable peripheral pulses. MUSCULOSKELETAL SYSTEM:  Generalized osteoarthritic changes, otherwise, normal. CENTRAL NERVOUS SYSTEM:  No focal neurologic deficit on gross examination.  Labs on Admission:  Results for orders placed during the hospital encounter of 01/18/13 (from the past 48 hour(s))  TROPONIN I     Status: None   Collection Time    01/18/13  5:53 AM      Result Value Range   Troponin I <0.30  <0.30 ng/mL   Comment:            Due to the release kinetics of cTnI,     a negative result within the first hours     of the onset of  symptoms does not rule out     myocardial infarction with certainty.     If myocardial infarction is still suspected,     repeat the test at appropriate intervals.  CBC WITH DIFFERENTIAL     Status: Abnormal   Collection Time    01/18/13  5:53 AM      Result Value Range   WBC 9.5  4.0 - 10.5 K/uL   RBC 4.01  3.87 - 5.11 MIL/uL   Hemoglobin 10.6 (*) 12.0 - 15.0 g/dL   HCT 21.3 (*) 08.6 - 57.8 %   MCV 84.3  78.0 - 100.0 fL   MCH 26.4  26.0 - 34.0 pg   MCHC 31.4  30.0 - 36.0 g/dL   RDW 46.9  62.9 - 52.8 %   Platelets 199  150 -  400 K/uL   Neutrophils Relative % 67  43 - 77 %   Neutro Abs 6.3  1.7 - 7.7 K/uL   Lymphocytes Relative 13  12 - 46 %   Lymphs Abs 1.2  0.7 - 4.0 K/uL   Monocytes Relative 7  3 - 12 %   Monocytes Absolute 0.7  0.1 - 1.0 K/uL   Eosinophils Relative 13 (*) 0 - 5 %   Eosinophils Absolute 1.2 (*) 0.0 - 0.7 K/uL   Basophils Relative 0  0 - 1 %   Basophils Absolute 0.0  0.0 - 0.1 K/uL  BASIC METABOLIC PANEL     Status: Abnormal   Collection Time    01/18/13  5:53 AM      Result Value Range   Sodium 136  135 - 145 mEq/L   Potassium 3.6  3.5 - 5.1 mEq/L   Chloride 103  96 - 112 mEq/L   CO2 22  19 - 32 mEq/L   Glucose, Bld 117 (*) 70 - 99 mg/dL   BUN 22  6 - 23 mg/dL   Creatinine, Ser 4.54 (*) 0.50 - 1.10 mg/dL   Calcium 9.8  8.4 - 09.8 mg/dL   GFR calc non Af Amer 42 (*) >90 mL/min   GFR calc Af Amer 49 (*) >90 mL/min   Comment:            The eGFR has been calculated     using the CKD EPI equation.     This calculation has not been     validated in all clinical     situations.     eGFR's persistently     <90 mL/min signify     possible Chronic Kidney Disease.  PRO B NATRIURETIC PEPTIDE     Status: None   Collection Time    01/18/13  5:53 AM      Result Value Range   Pro B Natriuretic peptide (BNP) 180.9  0 - 450 pg/mL    Radiological Exams on Admission: Dg Chest 2 View  01/18/2013   *RADIOLOGY REPORT*  Clinical Data: Shortness of breath and  cough.  CHEST - 2 VIEW  Comparison: PA and lateral chest 10/22/2012 and 11/02/2010.  Findings: Heart size is mildly enlarged and there is pulmonary vascular congestion.  No consolidative process, pneumothorax or effusion is identified.  IMPRESSION: No acute disease.   Original Report Authenticated By: Holley Dexter, M.D.    Assessment/Plan Active Problems:   1. Acute bronchitis: Patient presented with 4 days of progressive SOB, cough, wheeze, following a bout of pneumonia about 2 weeks ago. CXR was devoid of active disease, wcc is normal and patient is apyrexial. She has no previous history of COPD and is a non-smoker. Clinically, she appears to have an acute bronchitis, complicated by by bronchospasm. We shall admit to the telemetry floor, manage with iv Rocephin/Azithromycin, as well as [parenteral steroids, bronchodilator nebulizers and oxygen supplementation.  2. HTN (hypertension): Managing with pre-admission antihypertensives.  3. Type II or unspecified type diabetes mellitus: Per EMR, patient has a known history of DM-2. Per family, hypoglycemics were discontinued following her last hospitalization, in the setting of ARF, and although she was briefly placed on Metformin subsequently, this was eventually discontinued for fear of hypoglycemia. Currently diet controlled. Random blood glucose was only 117 on admission. Anticipate hypoglycemia on steroids. Will check HBA1C and cover with SSI.  4. Chronic kidney disease: Patient has known stage III CKD (baseline creatinine around 2). At presentation,  creatinine appears reasonable at 1.13. We shall follow renal indices, and avoid nephrotoxins.  5. Gout: Asmyptomatic at this time. Allopurinol will be continued.  6. Alzheimer's disease: Stable.  7. GERD: On PPI.   Further management will depend on clinical course.  Comment: Discussed DNR status with Son ad daughter-in-law. They confirmed that patient is DNR/DNI.    Time Spent on Admission: 1  hour.   Jaanai Salemi,CHRISTOPHER 01/18/2013, 9:40 AM

## 2013-01-18 NOTE — ED Provider Notes (Signed)
CSN: [id number]     Arrival date & time 01/18/13  0502 History     First MD Initiated Contact with Patient 01/18/13 [phone]     Chief Complaint  Patient presents with  . Shortness of Breath   (Consider location/radiation/quality/duration/timing/severity/associated sxs/prior Treatment) HPI  79 year old female shortness of breath. History primarily from son and daughter-in-law. They rport recent outpatient treatment for pneumonia. Shortly after this diagnosed with bronchitis and seemed to be improving while on steroids.  Over the past 2-3 days she's had increasing shortness of breath again. Pt denies pain anywhere. Occasional nonproductive cough. No fevers or chills. No unusual leg pain or leg swelling.   Past Medical History  Diagnosis Date  . Stroke   . Dementia   . HTN (hypertension)   . Hyperlipidemia   . GERD (gastroesophageal reflux disease)   . Gout   . Depression   . Renal failure   . Alzheimer's dementia    Past Surgical History  Procedure Laterality Date  . Cholecystectomy    . Total abdominal hysterectomy    . Cataract extraction w/ intraocular lens implant    . Laminectomy     Family History  Problem Relation Age of Onset  . Hypertension Mother   . Hypertension Father    History  Substance Use Topics  . Smoking status: Never Smoker   . Smokeless tobacco: Never Used  . Alcohol Use: No   OB History   Grav Para Term Preterm Abortions TAB SAB Ect Mult Living                 Review of Systems  All systems reviewed and negative, other than as noted in HPI.   Allergies  Review of patient's allergies indicates no known allergies.  Home Medications   Current Outpatient Rx  Name  Route  Sig  Dispense  Refill  . acetaminophen (MAPAP) 500 MG tablet   Oral   Take 1,000 mg by mouth every 8 (eight) hours as needed for pain.          Marland Kitchen allopurinol (ZYLOPRIM) 100 MG tablet   Oral   Take 100 mg by mouth every morning.          Marland Kitchen amLODipine (NORVASC) 10 MG  tablet   Oral   Take 10 mg by mouth every morning.          Marland Kitchen aspirin 81 MG tablet   Oral   Take 81 mg by mouth every morning.          . cloNIDine (CATAPRES - DOSED IN MG/24 HR) 0.3 mg/24hr   Transdermal   Place 1 patch (0.3 mg total) onto the skin every 7 (seven) days.   4 patch   3   . Cranberry 200 MG CAPS   Oral   Take 400 mg by mouth every morning.         . docusate (COLACE) 60 MG/15ML syrup   Oral   Take 40 mg by mouth 2 (two) times daily.         Marland Kitchen guaifenesin (ROBAFEN) 100 MG/5ML syrup   Oral   Take 100 mg by mouth every 4 (four) hours as needed for cough.         . hydrALAZINE (APRESOLINE) 50 MG tablet   Oral   Take 75 mg by mouth daily. Takes one and one half tablets to make 75mg          . HYDROcodone-acetaminophen (NORCO/VICODIN) 5-325 MG per tablet  Oral   Take 1-2 tablets by mouth every 6 (six) hours as needed for pain (pain).         Marland Kitchen ipratropium-albuterol (DUONEB) 0.5-2.5 (3) MG/3ML SOLN   Nebulization   Take 3 mLs by nebulization 2 (two) times daily.          Marland Kitchen labetalol (NORMODYNE) 200 MG tablet   Oral   Take 1 tablet (200 mg total) by mouth 3 (three) times daily.   90 tablet   3   . LORazepam (ATIVAN) 0.5 MG tablet   Oral   Take 0.5 mg by mouth 2 (two) times daily.         Marland Kitchen omeprazole (PRILOSEC) 40 MG capsule   Oral   Take 40 mg by mouth every morning.          Marland Kitchen oxybutynin (DITROPAN-XL) 5 MG 24 hr tablet   Oral   Take 5 mg by mouth every morning.         . polyethylene glycol (MIRALAX / GLYCOLAX) packet   Oral   Take 17 g by mouth every morning.         . triamcinolone cream (KENALOG) 0.5 %   Topical   Apply 1 application topically 3 (three) times daily. Applies to rash on forearm, upper back and arms in the morning and then to upper back and arms in the evening.         Marland Kitchen ZINC OXIDE EX   Topical   Apply 1 application topically 2 (two) times daily. Applies to skin breakdown on bottom.          BP  154/58  Pulse 70  Temp(Src) 97.6 F (36.4 C) (Oral)  Resp 20  SpO2 100% Physical Exam  Nursing note and vitals reviewed. Constitutional: She appears well-developed and well-nourished.  HENT:  Head: Normocephalic and atraumatic.  Eyes: Conjunctivae are normal. Right eye exhibits no discharge. Left eye exhibits no discharge.  Neck: Neck supple.  Cardiovascular: Normal rate, regular rhythm and normal heart sounds.  Exam reveals no gallop and no friction rub.   No murmur heard. Pulmonary/Chest: Effort normal. No respiratory distress. She has wheezes.  Abdominal: Soft. She exhibits no distension. There is no tenderness.  Musculoskeletal: She exhibits no edema and no tenderness.  Mild pitting LE edema. No calf tenderness.  Neurological: She is alert.  Skin: Skin is warm and dry. She is not diaphoretic.  Psychiatric: She has a normal mood and affect. Her behavior is normal. Thought content normal.    ED Course   Procedures (including critical care time)  Labs Reviewed  CBC WITH DIFFERENTIAL - Abnormal; Notable for the following:    Hemoglobin 10.6 (*)    HCT 33.8 (*)    Eosinophils Relative 13 (*)    Eosinophils Absolute 1.2 (*)    All other components within normal limits  BASIC METABOLIC PANEL - Abnormal; Notable for the following:    Glucose, Bld 117 (*)    Creatinine, Ser 1.13 (*)    GFR calc non Af Amer 42 (*)    GFR calc Af Amer 49 (*)    All other components within normal limits  TROPONIN I  PRO B NATRIURETIC PEPTIDE   Dg Chest 2 View  01/18/2013   *RADIOLOGY REPORT*  Clinical Data: Shortness of breath and cough.  CHEST - 2 VIEW  Comparison: PA and lateral chest 10/22/2012 and 11/02/2010.  Findings: Heart size is mildly enlarged and there is pulmonary vascular congestion.  No consolidative  process, pneumothorax or effusion is identified.  IMPRESSION: No acute disease.   Original Report Authenticated By: Holley Dexter, M.D.   1. Bronchospasm     MDM  [phone] with SOB.  On exam with diffuse wheezing even after 20mg  of albuterol and 1mg  atrovent. Steroids given. Oxygen saturations are ok, but remains tachypneic. CXR w/o focal infiltrate. Normal BNP pretty much excludes heart failure. Doubt anginal equivalent.   Raeford Razor, MD 01/18/13 [phone]

## 2013-01-18 NOTE — ED Notes (Signed)
Per EMS, pt from [facility].  Pt c/o shortness of breath.  Wheezing tonight.  Per facility, being treated for pneumonia but report from facility is vague.  Pt hx alzheimers, dysphagia, htn, dm, high lipids.  10 albuterol and .5 atrovent given in route. Coughing.  Vitals:158/72, 90, 94% ra, now 100% on 8 l [place] with nebulizer.  Oriented to self and at baseline per facility.NSR.  No pain.

## 2013-01-18 NOTE — Progress Notes (Signed)
Pt still easily agitated, yells "get out of my room" just with looking in to check on her.  Family at bedside now.

## 2013-01-18 NOTE — Progress Notes (Signed)
Family declined MyChart activation at this time.  Took proxy forms in case they changed their minds

## 2013-01-18 NOTE — ED Notes (Signed)
[id number]> Expected date:<BR> Expected time:<BR> Means of arrival:<BR> Comments:<BR> EMS, wheezing SHOB

## 2013-01-19 DIAGNOSIS — E785 Hyperlipidemia, unspecified: Secondary | ICD-10-CM

## 2013-01-19 DIAGNOSIS — J9801 Acute bronchospasm: Secondary | ICD-10-CM

## 2013-01-19 LAB — COMPREHENSIVE METABOLIC PANEL
Alkaline Phosphatase: 97 U/L (ref 39–117)
BUN: 22 mg/dL (ref 6–23)
Chloride: 108 mEq/L (ref 96–112)
Creatinine, Ser: 1.05 mg/dL (ref 0.50–1.10)
GFR calc Af Amer: 53 mL/min — ABNORMAL LOW (ref >90)
Glucose, Bld: 139 mg/dL — ABNORMAL HIGH (ref 70–99)
Potassium: 3.1 mEq/L — ABNORMAL LOW (ref 3.5–5.1)
Total Bilirubin: 0.1 mg/dL — ABNORMAL LOW (ref 0.3–1.2)
Total Protein: 6.8 g/dL (ref 6.0–8.3)

## 2013-01-19 LAB — GLUCOSE, CAPILLARY: Glucose-Capillary: 129 mg/dL — ABNORMAL HIGH (ref 70–99)

## 2013-01-19 LAB — CBC
Platelets: 179 10*3/uL (ref 150–400)
RBC: 3.6 MIL/uL — ABNORMAL LOW (ref 3.87–5.11)
RDW: 13.8 % (ref 11.5–15.5)
WBC: 10.3 10*3/uL (ref 4.0–10.5)

## 2013-01-19 MED ORDER — IPRATROPIUM BROMIDE 0.02 % IN SOLN
0.5000 mg | Freq: Four times a day (QID) | RESPIRATORY_TRACT | Status: DC
  Administered 2013-01-19 (×2): 0.5 mg via RESPIRATORY_TRACT
  Filled 2013-01-19 (×3): qty 2.5

## 2013-01-19 MED ORDER — ALBUTEROL SULFATE (5 MG/ML) 0.5% IN NEBU: 2.5000 mg | INHALATION_SOLUTION | RESPIRATORY_TRACT | Status: DC | PRN

## 2013-01-19 MED ORDER — PREDNISONE 10 MG PO TABS: ORAL_TABLET | ORAL | Status: DC

## 2013-01-19 MED ORDER — PREDNISONE 50 MG PO TABS
60.0000 mg | ORAL_TABLET | Freq: Every day | ORAL | Status: DC
  Administered 2013-01-19: 60 mg via ORAL
  Filled 2013-01-19: qty 1

## 2013-01-19 MED ORDER — AZITHROMYCIN 500 MG PO TABS: 500.0000 mg | ORAL_TABLET | Freq: Every day | ORAL | Status: DC

## 2013-01-19 MED ORDER — ALBUTEROL SULFATE (5 MG/ML) 0.5% IN NEBU
2.5000 mg | INHALATION_SOLUTION | Freq: Four times a day (QID) | RESPIRATORY_TRACT | Status: DC
  Administered 2013-01-19 (×2): 2.5 mg via RESPIRATORY_TRACT
  Filled 2013-01-19 (×3): qty 0.5

## 2013-01-19 NOTE — Progress Notes (Signed)
Called to the room by NT because pt had pulled her IV out, even though it was secured and wrapped with Kerlix. Pt had also pulled off her telemetry leads. Spoke with MD and orders received to d/c iv and d/c telemetry. The plan is for pt to be discharged back to her facility today. IV site assessed for bleeding and there was blood residue on the sheet, but no active bleeding at this time. Telemetry leads and stickers removed and pt bathed (she had an incontinent episode of urine). It took three staff members to bath pt and change gown and pads due to her combative behavior. Will continue to monitor pt and provide care.  Arta Bruce [facility] 01/19/2013 11:37 AM

## 2013-01-19 NOTE — Discharge Summary (Signed)
Physician Discharge Summary  Angela Cervantes ZOX:[id number] DOB: 11/22/1921 DOA: 01/18/2013  PCP: Doran Durand, MD  Admit date: 01/18/2013 Discharge date: 01/19/2013  Time spent: 35 minutes  Recommendations for Outpatient Follow-up:  1. Follow up with PCP, evaluate breathing  Discharge Diagnoses:  Active Problems:   Acute bronchitis   HTN (hypertension)   Dyslipidemia   Type II or unspecified type diabetes mellitus without mention of complication, not stated as uncontrolled   Chronic kidney disease   Alzheimer's disease   Discharge Condition: stable  Diet recommendation: regular  Filed Weights   01/18/13 1401  Weight: 72.258 kg (159 lb 4.8 oz)    History of present illness:  79 year old female resident of Memory Unit at [facility], with known history of Alzheimer's dementia, depression, HTN, s/p CVA, dyslipidemia, GERD, Gout, stage III CKD (baseline creatinine around 2), DM-2, overactive bladder, Psoriasis, s/p cholecystectomy, s/p TAH, s/p cataract surgery, s/p back surgery, presenting with progressive SOB/Wheeze. History is supplied by son and daughter in-law, as patient was unable to contribute in any meaningful way. About 2 weeks ago,, she had been diagnosed with pneumonia at SNF, and treated with a course of Levaquin, nebs and steroids, after which she improved, although she has had a residual cough. Over the last 4 days, however, patient has become progressively SOB, with worsening of cough, which is non-productive,. She has had no fever or chest pain. Family went to visit on 01/17/13, and stayed till 9:00 PM, then after they had gone home, they received a call at about 2:00 AM on 01/18/13, and were informed that patient was now worse, and was being taken to the ED.    Hospital Course:  Acute bronchitis: -  CXR was no active disease, no leukocytosis and afebrile. She has no previous history of COPD and is a non-smoker. - started empirically on Rocephin/Azithromycin. - change to  oral azithro and oral steroids.  HTN (hypertension):  - Managing with pre-admission antihypertensives.   Type II or unspecified type diabetes mellitus:  - No change cont home regimen.  Chronic kidney disease:  - Patient has known stage III CKD (baseline creatinine around 2). At presentation, creatinine appears reasonable at 1.13.    Procedures:  CXR  Consultations:  none  Discharge Exam: Filed Vitals:   01/19/13 0647  BP: 140/85  Pulse:   Temp: 98.1 F (36.7 C)  Resp: 20    General: A&O x2 Cardiovascular: RRR Respiratory: good air movement CTA B/L  Discharge Instructions  Discharge Orders   Future Orders Complete By Expires     Diet - low sodium heart healthy  As directed     Increase activity slowly  As directed         Medication List         allopurinol 100 MG tablet  Commonly known as:  ZYLOPRIM  Take 100 mg by mouth every morning.     amLODipine 10 MG tablet  Commonly known as:  NORVASC  Take 10 mg by mouth every morning.     aspirin 81 MG tablet  Take 81 mg by mouth every morning.     azithromycin 500 MG tablet  Commonly known as:  ZITHROMAX  Take 1 tablet (500 mg total) by mouth daily.     cloNIDine 0.3 mg/24hr patch  Commonly known as:  CATAPRES - Dosed in mg/24 hr  Place 1 patch (0.3 mg total) onto the skin every 7 (seven) days.     COLACE 60 MG/15ML syrup  Generic drug:  docusate  Take 40 mg by mouth 2 (two) times daily.     Cranberry 200 MG Caps  Take 400 mg by mouth every morning.     hydrALAZINE 50 MG tablet  Commonly known as:  APRESOLINE  Take 75 mg by mouth daily. Takes one and one half tablets to make 75mg      HYDROcodone-acetaminophen 5-325 MG per tablet  Commonly known as:  NORCO/VICODIN  Take 1-2 tablets by mouth every 6 (six) hours as needed for pain (pain).     ipratropium-albuterol 0.5-2.5 (3) MG/3ML Soln  Commonly known as:  DUONEB  Take 3 mLs by nebulization 2 (two) times daily.     labetalol 200 MG tablet   Commonly known as:  NORMODYNE  Take 1 tablet (200 mg total) by mouth 3 (three) times daily.     LORazepam 0.5 MG tablet  Commonly known as:  ATIVAN  Take 0.5 mg by mouth 2 (two) times daily.     MAPAP 500 MG tablet  Generic drug:  acetaminophen  Take 1,000 mg by mouth every 8 (eight) hours as needed for pain.     omeprazole 40 MG capsule  Commonly known as:  PRILOSEC  Take 40 mg by mouth every morning.     oxybutynin 5 MG 24 hr tablet  Commonly known as:  DITROPAN-XL  Take 5 mg by mouth every morning.     polyethylene glycol packet  Commonly known as:  MIRALAX / GLYCOLAX  Take 17 g by mouth every morning.     predniSONE 10 MG tablet  Commonly known as:  DELTASONE  Takes 6 tablets for 1 days, then 5 tablets for 1 days, then 4 tablets for 1 days, then 3 tablets for 1 days, then 2 tabs for 1 days, then 1 tab for 1 days, and then stop.     ROBAFEN 100 MG/5ML syrup  Generic drug:  guaifenesin  Take 100 mg by mouth every 4 (four) hours as needed for cough.     triamcinolone cream 0.1 %  Commonly known as:  KENALOG  Apply 1 application topically daily. Apply to dry spots after bathing     ZINC OXIDE EX  Apply 1 application topically 2 (two) times daily. Applies to skin breakdown on bottom.       No Known Allergies     Follow-up Information   Follow up with PATEL,BHAIRAVI, MD. (hospital follow up)    Contact information:   [facility] [address] [place] [address] [phone]        The results of significant diagnostics from this hospitalization (including imaging, microbiology, ancillary and laboratory) are listed below for reference.    Significant Diagnostic Studies: Dg Chest 2 View  01/18/2013   *RADIOLOGY REPORT*  Clinical Data: Shortness of breath and cough.  CHEST - 2 VIEW  Comparison: PA and lateral chest 10/22/2012 and 11/02/2010.  Findings: Heart size is mildly enlarged and there is pulmonary vascular congestion.  No consolidative process,  pneumothorax or effusion is identified.  IMPRESSION: No acute disease.   Original Report Authenticated By: Holley Dexter, M.D.    Microbiology: Recent Results (from the past 240 hour(s))  MRSA PCR SCREENING     Status: Abnormal   Collection Time    01/18/13 10:10 AM      Result Value Range Status   MRSA by PCR POSITIVE (*) NEGATIVE Final   Comment:            The  GeneXpert MRSA Assay (FDA     approved for NASAL specimens     only), is one component of a     comprehensive MRSA colonization     surveillance program. It is not     intended to diagnose MRSA     infection nor to guide or     monitor treatment for     MRSA infections.     RESULT CALLED TO, READ BACK BY AND VERIFIED WITH:     Isakson,B. AT 1223 ON 08.05.14 BY LOVE,T.     Labs: Basic Metabolic Panel:  Recent Labs Lab 01/18/13 0553 01/19/13 0705  NA 136 140  K 3.6 3.1*  CL 103 108  CO2 22 19  GLUCOSE 117* 139*  BUN 22 22  CREATININE 1.13* 1.05  CALCIUM 9.8 9.2   Liver Function Tests:  Recent Labs Lab 01/19/13 0705  AST 22  ALT 25  ALKPHOS 97  BILITOT 0.1*  PROT 6.8  ALBUMIN 2.8*   No results found for this basename: LIPASE, AMYLASE,  in the last 168 hours No results found for this basename: AMMONIA,  in the last 168 hours CBC:  Recent Labs Lab 01/18/13 0553 01/19/13 0705  WBC 9.5 10.3  NEUTROABS 6.3  --   HGB 10.6* 9.6*  HCT 33.8* 30.1*  MCV 84.3 83.6  PLT 199 179   Cardiac Enzymes:  Recent Labs Lab 01/18/13 0553 01/18/13 1135 01/18/13 2230  TROPONINI <0.30 <0.30 <0.30   BNP: BNP (last 3 results)  Recent Labs  01/18/13 0553  PROBNP 180.9   CBG:  Recent Labs Lab 01/18/13 1122 01/18/13 1702 01/18/13 2223 01/19/13 0806  GLUCAP 126* 121* 125* 129*       Signed:  Marinda Elk  [facility] 01/19/2013, 11:03 AM

## 2013-01-19 NOTE — Progress Notes (Signed)
Pt combative physically and verbally this morning and very agitated. She is swinging and biting at the NT and myself. She is also cursing at [place] and telling us to get out of her room. Pt will not allow me to auscultate or palpate for my head to toe assessment this am. Pt grabbed stethoscope and pulled it out of my ears and hands. Pt also would not allow me to assess her IV site which is wrapped in Kerlix. Pt grabbed my left arm and pinched me, leaving a red mark, when I attempted to assess IV site. The NT and I were able to obtain a CBG and give insulin shot by holding pt's hands down to prevent injury to ourselves. Pt refused breakfast this am. Will continue to monitor pt and attempt pt care as she will allow.   Arta Bruce [facility] 01/19/2013 8:46 AM

## 2013-01-19 NOTE — Progress Notes (Signed)
Clinical Social Work Department BRIEF PSYCHOSOCIAL ASSESSMENT 01/19/2013  Patient:  Angela Cervantes, Angela Cervantes     Account Number:  [id number]     Admit date:  01/18/2013  Clinical Social Worker:  Orpah Greek  Date/Time:  01/19/2013 10:57 AM  Referred by:  Physician  Date Referred:  01/19/2013 Referred for  Other - See comment   Other Referral:   Admitted from: [facility]   Interview type:  Family Other interview type:   patient's daughter-in-law, Dianne    PSYCHOSOCIAL DATA Living Status:  FACILITY Admitted from facility:  [facility] Level of care:  Assisted Living Primary support name:  Fayrene Fearing & Francesco Runner (son/daughter-in-law) h#: [phone] Primary support relationship to patient:  CHILD, ADULT Degree of support available:   good    CURRENT CONCERNS Current Concerns  Post-Acute Placement   Other Concerns:    SOCIAL WORK ASSESSMENT / PLAN CSW received consult that patient was admitted from [facility].   Assessment/plan status:  Information/Referral to [facility] Other assessment/ plan:   Information/referral to community resources:   CSW completed FL2 and faxed information to ALF, confirmed with Arlee Muslim (resident care coordinator @ ALF) that they will be able to take patient back when stable.    PATIENT'S/FAMILY'S RESPONSE TO PLAN OF CARE: Patient's daughter-in-law, Graciella Belton was hesitant about patient returning to ALF before she's stable. Patient's son at bedside.       Unice Bailey, LCSW [facility] Clinical Social Worker cell #: [phone]

## 2013-01-19 NOTE — Progress Notes (Signed)
Report called to [place], Charity fundraiser at [employer]. She was familiar with the patient and so an update on plan of care was given to her. Will get pt dressed when son returns and call ambulance for transport.   Arta Bruce [facility] 01/19/2013 2:57 PM

## 2013-01-19 NOTE — Progress Notes (Signed)
Patient is set to discharge back to [facility] today. Patient & son, Fayrene Fearing at bedside and aware. Discharge packet in [place]. CSW confirmed with Pearletha Alfred scheduled for 3:45 pickup. (Service Request Id: [id number]).  Unice Bailey, LCSW [facility] Clinical Social Worker cell #: [phone]

## 2013-02-28 ENCOUNTER — Emergency Department (HOSPITAL_COMMUNITY)
Admission: EM | Admit: 2013-02-28 | Discharge: 2013-02-28 | Disposition: A | Discharge status: Skilled Nursing Facility | Payer: Medicare Other | Attending: Emergency Medicine | Admitting: Emergency Medicine

## 2013-02-28 ENCOUNTER — Emergency Department (HOSPITAL_COMMUNITY): Payer: Medicare Other

## 2013-02-28 ENCOUNTER — Encounter (HOSPITAL_COMMUNITY): Payer: Self-pay | Admitting: *Deleted

## 2013-02-28 DIAGNOSIS — E785 Hyperlipidemia, unspecified: Secondary | ICD-10-CM | POA: Insufficient documentation

## 2013-02-28 DIAGNOSIS — Y9389 Activity, other specified: Secondary | ICD-10-CM | POA: Insufficient documentation

## 2013-02-28 DIAGNOSIS — S0990XA Unspecified injury of head, initial encounter: Secondary | ICD-10-CM | POA: Insufficient documentation

## 2013-02-28 DIAGNOSIS — M109 Gout, unspecified: Secondary | ICD-10-CM | POA: Insufficient documentation

## 2013-02-28 DIAGNOSIS — Z79899 Other long term (current) drug therapy: Secondary | ICD-10-CM | POA: Insufficient documentation

## 2013-02-28 DIAGNOSIS — F329 Major depressive disorder, single episode, unspecified: Secondary | ICD-10-CM | POA: Insufficient documentation

## 2013-02-28 DIAGNOSIS — F3289 Other specified depressive episodes: Secondary | ICD-10-CM | POA: Insufficient documentation

## 2013-02-28 DIAGNOSIS — I129 Hypertensive chronic kidney disease with stage 1 through stage 4 chronic kidney disease, or unspecified chronic kidney disease: Secondary | ICD-10-CM | POA: Insufficient documentation

## 2013-02-28 DIAGNOSIS — IMO0002 Reserved for concepts with insufficient information to code with codable children: Secondary | ICD-10-CM | POA: Insufficient documentation

## 2013-02-28 DIAGNOSIS — F0280 Dementia in other diseases classified elsewhere without behavioral disturbance: Secondary | ICD-10-CM | POA: Insufficient documentation

## 2013-02-28 DIAGNOSIS — Y9210 Unspecified residential institution as the place of occurrence of the external cause: Secondary | ICD-10-CM | POA: Insufficient documentation

## 2013-02-28 DIAGNOSIS — W19XXXA Unspecified fall, initial encounter: Secondary | ICD-10-CM

## 2013-02-28 DIAGNOSIS — Z7982 Long term (current) use of aspirin: Secondary | ICD-10-CM | POA: Insufficient documentation

## 2013-02-28 DIAGNOSIS — K219 Gastro-esophageal reflux disease without esophagitis: Secondary | ICD-10-CM | POA: Insufficient documentation

## 2013-02-28 DIAGNOSIS — G309 Alzheimer's disease, unspecified: Secondary | ICD-10-CM | POA: Insufficient documentation

## 2013-02-28 DIAGNOSIS — W050XXA Fall from non-moving wheelchair, initial encounter: Secondary | ICD-10-CM | POA: Insufficient documentation

## 2013-02-28 DIAGNOSIS — Y92129 Unspecified place in nursing home as the place of occurrence of the external cause: Secondary | ICD-10-CM

## 2013-02-28 DIAGNOSIS — N19 Unspecified kidney failure: Secondary | ICD-10-CM | POA: Insufficient documentation

## 2013-02-28 NOTE — ED Notes (Signed)
ptar called for pt transportation back to nursing facility

## 2013-02-28 NOTE — ED Provider Notes (Signed)
CSN: [id number]     Arrival date & time 02/28/13  1146 History   First MD Initiated Contact with Patient 02/28/13 1218     Chief Complaint  Patient presents with  . Fall    Patient is a 79 y.o. female presenting with fall. The history is provided by the EMS personnel and the nursing home. The history is limited by the condition of the patient (Hx dementia).  Fall  Pt was seen at 1230. Per EMS, NH report and pt's family, pt s/p fall PTA. Pt was playing a game of beanbags and scooted to the edge of her wheelchair. Family states she leaned forward and fell out of her wheelchair, hitting her head. No LOC per NH staff who witnessed the fall. Pt herself denies any complaints.    Past Medical History  Diagnosis Date  . Stroke   . Dementia   . HTN (hypertension)   . Hyperlipidemia   . GERD (gastroesophageal reflux disease)   . Gout   . Depression   . Renal failure   . Alzheimer's dementia    Past Surgical History  Procedure Laterality Date  . Cholecystectomy    . Total abdominal hysterectomy    . Cataract extraction w/ intraocular lens implant    . Laminectomy     Family History  Problem Relation Age of Onset  . Hypertension Mother   . Hypertension Father    History  Substance Use Topics  . Smoking status: Never Smoker   . Smokeless tobacco: Never Used  . Alcohol Use: No    Review of Systems  Unable to perform ROS: Dementia    Allergies  Review of patient's allergies indicates no known allergies.  Home Medications   Current Outpatient Rx  Name  Route  Sig  Dispense  Refill  . allopurinol (ZYLOPRIM) 100 MG tablet   Oral   Take 100 mg by mouth every morning.          Marland Kitchen amLODipine (NORVASC) 10 MG tablet   Oral   Take 10 mg by mouth every morning.          Marland Kitchen aspirin 81 MG tablet   Oral   Take 81 mg by mouth every morning.          . cloNIDine (CATAPRES - DOSED IN MG/24 HR) 0.3 mg/24hr   Transdermal   Place 1 patch (0.3 mg total) onto the skin every 7  (seven) days.   4 patch   3   . Cranberry 200 MG CAPS   Oral   Take 400 mg by mouth every morning.         . docusate (COLACE) 60 MG/15ML syrup   Oral   Take 40 mg by mouth 2 (two) times daily.         . Fluticasone-Salmeterol (ADVAIR DISKUS) 100-50 MCG/DOSE AEPB   Inhalation   Inhale 1 puff into the lungs 2 (two) times daily.         Marland Kitchen guaiFENesin (MUCINEX) 600 MG 12 hr tablet   Oral   Take 600 mg by mouth 2 (two) times daily.         Marland Kitchen guaifenesin (ROBAFEN) 100 MG/5ML syrup   Oral   Take 100 mg by mouth every 4 (four) hours as needed for cough.         . hydrALAZINE (APRESOLINE) 50 MG tablet   Oral   Take 75 mg by mouth daily. Takes one and one half tablets to make 75mg          .  ipratropium-albuterol (DUONEB) 0.5-2.5 (3) MG/3ML SOLN   Nebulization   Take 3 mLs by nebulization every 4 (four) hours as needed (shortness of breath or wheezing).          . labetalol (NORMODYNE) 200 MG tablet   Oral   Take 1 tablet (200 mg total) by mouth 3 (three) times daily.   90 tablet   3   . LORazepam (ATIVAN) 0.5 MG tablet   Oral   Take 0.5 mg by mouth 2 (two) times daily.         Marland Kitchen omeprazole (PRILOSEC) 40 MG capsule   Oral   Take 40 mg by mouth every morning.          Marland Kitchen oxybutynin (DITROPAN-XL) 5 MG 24 hr tablet   Oral   Take 5 mg by mouth every morning.         . polyethylene glycol (MIRALAX / GLYCOLAX) packet   Oral   Take 17 g by mouth every morning.         . triamcinolone cream (KENALOG) 0.1 %   Topical   Apply 1 application topically daily. Apply to dry spots after bathing         . ZINC OXIDE EX   Topical   Apply 1 application topically 2 (two) times daily. Applies to skin breakdown on bottom.         Marland Kitchen acetaminophen (MAPAP) 500 MG tablet   Oral   Take 1,000 mg by mouth every 8 (eight) hours as needed for pain.          Marland Kitchen azithromycin (ZITHROMAX) 500 MG tablet   Oral   Take 1 tablet (500 mg total) by mouth daily.   5  tablet   0   . HYDROcodone-acetaminophen (NORCO/VICODIN) 5-325 MG per tablet   Oral   Take 1-2 tablets by mouth every 6 (six) hours as needed for pain (pain).         . predniSONE (DELTASONE) 10 MG tablet      Takes 6 tablets for 1 days, then 5 tablets for 1 days, then 4 tablets for 1 days, then 3 tablets for 1 days, then 2 tabs for 1 days, then 1 tab for 1 days, and then stop.   21 tablet   0    BP 150/60  Pulse 71  Temp(Src) 98 F (36.7 C) (Oral)  Resp 20  SpO2 98% Physical Exam 1335: Physical examination: Vital signs and O2 SAT: Reviewed; Constitutional: Well developed, Well nourished, Well hydrated, In no acute distress; Head and Face: Normocephalic, Atraumatic; Eyes: EOMI, PERRL, No scleral icterus; ENMT: Mouth and pharynx normal, Left TM normal, Right TM normal, Mucous membranes moist; Neck: Supple,Trachea midline; Spine: No midline CS, TS, LS tenderness.; Cardiovascular: Regular rate and rhythm, No gallop; Respiratory: Breath sounds clear & equal bilaterally, No rales, rhonchi, wheezes, Normal respiratory effort/excursion; Chest: Nontender, No deformity, Movement normal, No crepitus, No abrasions or ecchymosis.; Abdomen: Soft, Nontender, Nondistended, Normal bowel sounds, No abrasions or ecchymosis.; Genitourinary: No CVA tenderness;; Extremities: No deformity, Full range of motion major/large joints of bilat UE's and LE's without pain or tenderness to palp, Neurovascularly intact, Pulses normal, No tenderness, No edema, Pelvis stable; Neuro: Awake, alert, confused re: time, place, events per hx dementia. Major CN grossly intact. Speech clear. Grips equal. Strength 5/5 equal bilat UE's and LE's. Moves all ext well on stretcher spontaneously without apparent gross focal motor deficits.; Skin: Color normal, Warm, Dry   ED Course  Procedures  MDM  MDM Reviewed: previous chart, nursing note and vitals Interpretation: CT scan    Ct Head Wo Contrast 02/28/2013   *RADIOLOGY  REPORT*  Clinical Data:  Fall. Dementia.  Renal failure.  Hypertension and hyperlipidemia.  CT HEAD WITHOUT CONTRAST CT CERVICAL SPINE WITHOUT CONTRAST  Technique:  Multidetector CT imaging of the head and cervical spine was performed following the standard protocol without intravenous contrast.  Multiplanar CT image reconstructions of the cervical spine were also generated.  Comparison:  10/22/2012 head CT.  No comparison cervical spine CT.  CT HEAD  Findings: Fibrous dysplasia left sphenoid/frontal calvarium.  Less likely consideration for this appearance is metastatic disease. Appearance is unchanged.  Minimal partial opacification ethmoid sinus air cells and sphenoid sinus air cells.  No skull fracture noted.  No intracranial hemorrhage.  Small vessel disease type changes without CT evidence of large acute infarct.  No intracranial mass lesion detected on this unenhanced exam.  Vascular calcifications.  IMPRESSION: No intracranial hemorrhage.  Small vessel disease type changes without CT evidence of large acute infarct.  Probable fibrous dysplasia left sphenoid sinus and left frontal and barium unchanged.  Minimal partial opacification ethmoid sinus air cells and sphenoid sinus air cells.  CT CERVICAL SPINE  Findings: No cervical spine fracture or static evidence of cervical spine injury.  The straightening of the cervical spine may be related head positioning or muscle spasm.  Cervical spondylotic changes with various degrees of spinal stenosis and foraminal narrowing.  No abnormal prevertebral soft tissue swelling.  Right thyroid low density 3.1 cm mass with substernal extension. This can be assessed with thyroid ultrasound.  Carotid bifurcation calcifications.  IMPRESSION: No cervical spine fracture or static evidence of cervical spine injury.  The straightening of the cervical spine may be related head positioning or muscle spasm.  Cervical spondylotic changes with various degrees of spinal stenosis and  foraminal narrowing.  Right thyroid low density 3.1 cm mass with substernal extension. This can be assessed with thyroid ultrasound.   Original Report Authenticated By: Lacy Duverney, M.D.   Ct Cervical Spine Wo Contrast 02/28/2013   *RADIOLOGY REPORT*  Clinical Data:  Fall. Dementia.  Renal failure.  Hypertension and hyperlipidemia.  CT HEAD WITHOUT CONTRAST CT CERVICAL SPINE WITHOUT CONTRAST  Technique:  Multidetector CT imaging of the head and cervical spine was performed following the standard protocol without intravenous contrast.  Multiplanar CT image reconstructions of the cervical spine were also generated.  Comparison:  10/22/2012 head CT.  No comparison cervical spine CT.  CT HEAD  Findings: Fibrous dysplasia left sphenoid/frontal calvarium.  Less likely consideration for this appearance is metastatic disease. Appearance is unchanged.  Minimal partial opacification ethmoid sinus air cells and sphenoid sinus air cells.  No skull fracture noted.  No intracranial hemorrhage.  Small vessel disease type changes without CT evidence of large acute infarct.  No intracranial mass lesion detected on this unenhanced exam.  Vascular calcifications.  IMPRESSION: No intracranial hemorrhage.  Small vessel disease type changes without CT evidence of large acute infarct.  Probable fibrous dysplasia left sphenoid sinus and left frontal and barium unchanged.  Minimal partial opacification ethmoid sinus air cells and sphenoid sinus air cells.  CT CERVICAL SPINE  Findings: No cervical spine fracture or static evidence of cervical spine injury.  The straightening of the cervical spine may be related head positioning or muscle spasm.  Cervical spondylotic changes with various degrees of spinal stenosis and foraminal narrowing.  No abnormal prevertebral soft tissue swelling.  Right thyroid low density 3.1 cm mass with substernal extension. This can be assessed with thyroid ultrasound.  Carotid bifurcation calcifications.   IMPRESSION: No cervical spine fracture or static evidence of cervical spine injury.  The straightening of the cervical spine may be related head positioning or muscle spasm.  Cervical spondylotic changes with various degrees of spinal stenosis and foraminal narrowing.  Right thyroid low density 3.1 cm mass with substernal extension. This can be assessed with thyroid ultrasound.   Original Report Authenticated By: Lacy Duverney, M.D.    1400:  No acute findings on CT scans; family reassured. Request pt be sent back to NH now.  Dx and testing d/w pt and family.  Questions answered.  Verb understanding, agreeable to d/c back to nursing home with outpt f/u.   Laray Anger, DO 03/02/13 2334

## 2013-02-28 NOTE — ED Notes (Signed)
Bed: WA23 Expected date:  Expected time:  Means of arrival:  Comments: ems 

## 2013-02-28 NOTE — ED Notes (Addendum)
Per ems: pt from [facility], hx of dementia. Was sitting in wheelchair and scooted to the edge while playing bags, staff witnessed pt fall out of chair. States pt hit her head, no LOC. Denies pain, no deformities noted. bp 142/86, pulse 80, respirations 16, saO2 98%ra. DNR

## 2013-07-14 ENCOUNTER — Emergency Department (HOSPITAL_COMMUNITY): Payer: Medicare Other

## 2013-07-14 ENCOUNTER — Emergency Department (HOSPITAL_COMMUNITY)
Admission: EM | Admit: 2013-07-14 | Discharge: 2013-07-14 | Disposition: A | Discharge status: Home / Self Care | Payer: Medicare Other | Attending: Emergency Medicine | Admitting: Emergency Medicine

## 2013-07-14 ENCOUNTER — Encounter (HOSPITAL_COMMUNITY): Payer: Self-pay | Admitting: Emergency Medicine

## 2013-07-14 DIAGNOSIS — Y9210 Unspecified residential institution as the place of occurrence of the external cause: Secondary | ICD-10-CM | POA: Insufficient documentation

## 2013-07-14 DIAGNOSIS — Z79899 Other long term (current) drug therapy: Secondary | ICD-10-CM | POA: Insufficient documentation

## 2013-07-14 DIAGNOSIS — W050XXA Fall from non-moving wheelchair, initial encounter: Secondary | ICD-10-CM | POA: Insufficient documentation

## 2013-07-14 DIAGNOSIS — W19XXXA Unspecified fall, initial encounter: Secondary | ICD-10-CM

## 2013-07-14 DIAGNOSIS — Z8673 Personal history of transient ischemic attack (TIA), and cerebral infarction without residual deficits: Secondary | ICD-10-CM | POA: Insufficient documentation

## 2013-07-14 DIAGNOSIS — F3289 Other specified depressive episodes: Secondary | ICD-10-CM | POA: Insufficient documentation

## 2013-07-14 DIAGNOSIS — S01501A Unspecified open wound of lip, initial encounter: Secondary | ICD-10-CM | POA: Insufficient documentation

## 2013-07-14 DIAGNOSIS — I10 Essential (primary) hypertension: Secondary | ICD-10-CM | POA: Insufficient documentation

## 2013-07-14 DIAGNOSIS — K219 Gastro-esophageal reflux disease without esophagitis: Secondary | ICD-10-CM | POA: Insufficient documentation

## 2013-07-14 DIAGNOSIS — S0990XA Unspecified injury of head, initial encounter: Secondary | ICD-10-CM | POA: Insufficient documentation

## 2013-07-14 DIAGNOSIS — S46909A Unspecified injury of unspecified muscle, fascia and tendon at shoulder and upper arm level, unspecified arm, initial encounter: Secondary | ICD-10-CM | POA: Insufficient documentation

## 2013-07-14 DIAGNOSIS — S4980XA Other specified injuries of shoulder and upper arm, unspecified arm, initial encounter: Secondary | ICD-10-CM | POA: Insufficient documentation

## 2013-07-14 DIAGNOSIS — Z792 Long term (current) use of antibiotics: Secondary | ICD-10-CM | POA: Insufficient documentation

## 2013-07-14 DIAGNOSIS — N390 Urinary tract infection, site not specified: Secondary | ICD-10-CM | POA: Insufficient documentation

## 2013-07-14 DIAGNOSIS — IMO0002 Reserved for concepts with insufficient information to code with codable children: Secondary | ICD-10-CM | POA: Insufficient documentation

## 2013-07-14 DIAGNOSIS — F0280 Dementia in other diseases classified elsewhere without behavioral disturbance: Secondary | ICD-10-CM | POA: Insufficient documentation

## 2013-07-14 DIAGNOSIS — Z87448 Personal history of other diseases of urinary system: Secondary | ICD-10-CM | POA: Insufficient documentation

## 2013-07-14 DIAGNOSIS — G309 Alzheimer's disease, unspecified: Secondary | ICD-10-CM | POA: Insufficient documentation

## 2013-07-14 DIAGNOSIS — Y9389 Activity, other specified: Secondary | ICD-10-CM | POA: Insufficient documentation

## 2013-07-14 DIAGNOSIS — F329 Major depressive disorder, single episode, unspecified: Secondary | ICD-10-CM | POA: Insufficient documentation

## 2013-07-14 DIAGNOSIS — M109 Gout, unspecified: Secondary | ICD-10-CM | POA: Insufficient documentation

## 2013-07-14 LAB — URINALYSIS, ROUTINE W REFLEX MICROSCOPIC
BILIRUBIN URINE: NEGATIVE
GLUCOSE, UA: NEGATIVE mg/dL
HGB URINE DIPSTICK: NEGATIVE
Ketones, ur: NEGATIVE mg/dL
Nitrite: POSITIVE — AB
PH: 6.5 (ref 5.0–8.0)
Protein, ur: NEGATIVE mg/dL
SPECIFIC GRAVITY, URINE: 1.014 (ref 1.005–1.030)
UROBILINOGEN UA: 0.2 mg/dL (ref 0.0–1.0)

## 2013-07-14 LAB — URINE MICROSCOPIC-ADD ON

## 2013-07-14 LAB — CBC WITH DIFFERENTIAL/PLATELET
BASOS ABS: 0 10*3/uL (ref 0.0–0.1)
BASOS PCT: 0 % (ref 0–1)
EOS PCT: 7 % — AB (ref 0–5)
Eosinophils Absolute: 0.6 10*3/uL (ref 0.0–0.7)
HCT: 38.3 % (ref 36.0–46.0)
Hemoglobin: 12.8 g/dL (ref 12.0–15.0)
LYMPHS PCT: 17 % (ref 12–46)
Lymphs Abs: 1.5 10*3/uL (ref 0.7–4.0)
MCH: 27 pg (ref 26.0–34.0)
MCHC: 33.4 g/dL (ref 30.0–36.0)
MCV: 80.8 fL (ref 78.0–100.0)
MONO ABS: 1.1 10*3/uL — AB (ref 0.1–1.0)
Monocytes Relative: 12 % (ref 3–12)
NEUTROS ABS: 5.9 10*3/uL (ref 1.7–7.7)
Neutrophils Relative %: 65 % (ref 43–77)
PLATELETS: 270 10*3/uL (ref 150–400)
RBC: 4.74 MIL/uL (ref 3.87–5.11)
RDW: 13.7 % (ref 11.5–15.5)
WBC: 9.1 10*3/uL (ref 4.0–10.5)

## 2013-07-14 LAB — COMPREHENSIVE METABOLIC PANEL
ALBUMIN: 3.2 g/dL — AB (ref 3.5–5.2)
ALT: 14 U/L (ref 0–35)
AST: 24 U/L (ref 0–37)
Alkaline Phosphatase: 112 U/L (ref 39–117)
BUN: 28 mg/dL — ABNORMAL HIGH (ref 6–23)
CALCIUM: 9.5 mg/dL (ref 8.4–10.5)
CO2: 22 meq/L (ref 19–32)
CREATININE: 1.13 mg/dL — AB (ref 0.50–1.10)
Chloride: 102 mEq/L (ref 96–112)
GFR calc Af Amer: 48 mL/min — ABNORMAL LOW (ref >90)
GFR, EST NON AFRICAN AMERICAN: 42 mL/min — AB (ref >90)
Glucose, Bld: 80 mg/dL (ref 70–99)
Potassium: 4 mEq/L (ref 3.7–5.3)
SODIUM: 139 meq/L (ref 137–147)
Total Bilirubin: 0.3 mg/dL (ref 0.3–1.2)
Total Protein: 7.6 g/dL (ref 6.0–8.3)

## 2013-07-14 MED ORDER — ZOLPIDEM TARTRATE 5 MG PO TABS: 5.0000 mg | ORAL_TABLET | Freq: Every evening | ORAL | Status: AC | PRN

## 2013-07-14 MED ORDER — LORAZEPAM 2 MG/ML IJ SOLN: 1.0000 mg | Freq: Once | INTRAMUSCULAR | Status: AC

## 2013-07-14 MED ORDER — LORAZEPAM 2 MG/ML IJ SOLN
1.0000 mg | Freq: Once | INTRAMUSCULAR | Status: AC
  Administered 2013-07-14: 1 mg via INTRAVENOUS
  Filled 2013-07-14: qty 1

## 2013-07-14 MED ORDER — SODIUM CHLORIDE 0.9 % IV BOLUS (SEPSIS)
500.0000 mL | Freq: Once | INTRAVENOUS | Status: AC
  Administered 2013-07-14: 500 mL via INTRAVENOUS

## 2013-07-14 MED ORDER — LORAZEPAM 2 MG/ML IJ SOLN
INTRAMUSCULAR | Status: AC
  Administered 2013-07-14: 1 mg via INTRAVENOUS
  Filled 2013-07-14: qty 1

## 2013-07-14 NOTE — ED Notes (Signed)
Per EMS - pt coming from [facility]. Hx of dementia and currently being treated for a UTI. Pt has been combative with EMS throughout transport. Pt started on antibiotics today. Pt threw herself out of wheelchair, landed on right shoulder. Didn't hit head/LOC. Pt has some dried bld around right corner of mouth. Per staff there - pt is normally able to talk to staff and is more alert than this. Since the UTI pt has been acting different. BP 170 palpated. HR 80.

## 2013-07-14 NOTE — ED Notes (Signed)
Pt's daughter arrived, updated on pt's condition and told that pt is currently in radiology. Reports she doesn't want another UA if its not necessary. Pt has known UTI and currently on antibiotics.

## 2013-07-14 NOTE — ED Notes (Signed)
Family will be back soon. Left numbers: Francesco Runnerianne Jones [phone] Shary KeyJames Jones [phone]

## 2013-07-14 NOTE — Discharge Instructions (Signed)
Ambien 5 mg at night to be used as a sleep aid.  Return to the emergency department if you develop severe headache, difficulty breathing, or other new or concerning symptoms.   Fall Prevention in Hospitals As a hospital patient, your condition and the treatments you receive can increase your risk for falls. Some additional risk factors for falls in a hospital include:  Being in an unfamiliar environment.  Being on bed rest.  Your surgery.  Taking certain medicines.  Your tubing requirements, such as intravenous (IV) therapy or catheters. It is important that you learn how to decrease fall risks while at the hospital. Below are important tips that can help prevent falls. SAFETY TIPS FOR PREVENTING FALLS Talk about your risk of falling.  Ask your caregiver why you are at risk for falling. Is it your medicine, illness, tubing placement, or something else?  Make a plan with your caregiver to keep you safe from falls.  Ask your caregiver or pharmacist about side effect of your medicines. Some medicines can make you dizzy or affect your coordination. Ask for help.  Ask for help before getting out of bed. You may need to press your call button.  Ask for assistance in getting you safely to the toilet.  Ask for a walker or cane to be put at your bedside. Ask that most of the side rails on your bed be placed up before your caregiver leaves the room.  Ask family or friends to sit with you.  Ask for things that are out of your reach, such as your glasses, hearing aids, telephone, bedside table, or call button. Follow these tips to avoid falling:  Stay lying or seated, rather than standing, while waiting for help.  Wear rubber-soled slippers or shoes whenever you walk in the hospital.  Avoid quick, sudden movements.  Change positions slowly.  Sit on the side of your bed before standing.  Stand up slowly and wait before you start to walk.  Let your caregiver know if there is a  spill on the floor.  Pay careful attention to the medical equipment, electrical cords, and tubes around you.  When you need help, use your call button by your bed or in the bathroom. Wait for one of your caregivers to help you.  If you feel dizzy or unsure of your footing, return to bed and wait for assistance.  Avoid being distracted by the TV, telephone, or another person in your room.  Do not lean or support yourself on rolling objects, such as IV poles or bedside tables. Document Released: 05/30/2000 Document Revised: 05/19/2012 Document Reviewed: 02/08/2012 [facility] Patient Information 2014 [place], [place].  Head Injury, Adult You have received a head injury. It does not appear serious at this time. Headaches and vomiting are common following head injury. It should be easy to awaken from sleeping. Sometimes it is necessary for you to stay in the emergency department for a while for observation. Sometimes admission to the hospital may be needed. After injuries such as yours, most problems occur within the first 24 hours, but side effects may occur up to 7 10 days after the injury. It is important for you to carefully monitor your condition and contact your health care provider or seek immediate medical care if there is a change in your condition. WHAT ARE THE TYPES OF HEAD INJURIES? Head injuries can be as minor as a bump. Some head injuries can be more severe. More severe head injuries include:  A jarring injury  to the brain (concussion).  A bruise of the brain (contusion). This mean there is bleeding in the brain that can cause swelling.  A cracked skull (skull fracture).  Bleeding in the brain that collects, clots, and forms a bump (hematoma). WHAT CAUSES A HEAD INJURY? A serious head injury is most likely to happen to someone who is in a car wreck and is not wearing a seat belt. Other causes of major head injuries include bicycle or motorcycle accidents, sports injuries, and  falls. HOW ARE HEAD INJURIES DIAGNOSED? A complete history of the event leading to the injury and your current symptoms will be helpful in diagnosing head injuries. Many times, pictures of the brain, such as CT or MRI are needed to see the extent of the injury. Often, an overnight hospital stay is necessary for observation.  WHEN SHOULD I SEEK IMMEDIATE MEDICAL CARE?  You should get help right away if:  You have confusion or drowsiness.  You feel sick to your stomach (nauseous) or have continued, forceful vomiting.  You have dizziness or unsteadiness that is getting worse.  You have severe, continued headaches not relieved by medicine. Only take over-the-counter or prescription medicines for pain, fever, or discomfort as directed by your health care provider.  You do not have normal function of the arms or legs or are unable to walk.  You notice changes in the black spots in the center of the colored part of your eye (pupil).  You have a clear or bloody fluid coming from your nose or ears.  You have a loss of vision. During the next 24 hours after the injury, you must stay with someone who can watch you for the warning signs. This person should contact local emergency services (911 in the [place] if you have seizures, you become unconscious, or you are unable to wake up. HOW CAN I PREVENT A HEAD INJURY IN THE FUTURE? The most important factor for preventing major head injuries is avoiding motor vehicle accidents. To minimize the potential for damage to your head, it is crucial to wear seat belts while riding in motor vehicles. Wearing helmets while bike riding and playing collision sports (like football) is also helpful. Also, avoiding dangerous activities around the house will further help reduce your risk of head injury.  WHEN CAN I RETURN TO NORMAL ACTIVITIES AND ATHLETICS? You should be reevaluated by your health care provider before returning to these activities. If you have any of the  following symptoms, you should not return to activities or contact sports until 1 week after the symptoms have stopped:  Persistent headache.  Dizziness or vertigo.  Poor attention and concentration.  Confusion.  Memory problems.  Nausea or vomiting.  Fatigue or tire easily.  Irritability.  Intolerant of bright lights or loud noises.  Anxiety or depression.  Disturbed sleep. MAKE SURE YOU:   Understand these instructions.  Will watch your condition.  Will get help right away if you are not doing well or get worse. Document Released: 06/02/2005 Document Revised: 03/23/2013 Document Reviewed: 02/07/2013 [facility] Patient Information 2014 [place], [place].

## 2013-07-14 NOTE — Progress Notes (Signed)
CSW spoke with pt.'s family about [facility] [place] [phone] pt.'s family stated it is the plan for pt to return. CSW placed to [facility] to convey information.    [address] Ke[address] Cervantes, [place] [phone]

## 2013-07-14 NOTE — ED Notes (Signed)
Pt trying to bite and hit staff. Pt told this is not appropriate. Pt sts  "you can kiss my ass, bitch"

## 2013-07-14 NOTE — ED Notes (Signed)
PTAR at bedside 

## 2013-07-14 NOTE — ED Notes (Signed)
Pt trying to hit staff when they get near or touch pt. Pt also attempting to bite pt.

## 2013-07-14 NOTE — ED Notes (Signed)
Attempted to draw bld off IV site, pt moving too much unable to get bld.

## 2013-07-14 NOTE — ED Provider Notes (Signed)
CSN: [id number]     Arrival date & time 07/14/13  1022 History   First MD Initiated Contact with Patient 07/14/13 1029     Chief Complaint  Patient presents with  . Fall   (Consider location/radiation/quality/duration/timing/severity/associated sxs/prior Treatment) HPI Comments: Patient is an 80 year old female with history of CVA and dementia. His brought for evaluation of a fall occurred at the nursing home. She apparently fell out of her wheelchair and landed on her right shoulder. She has bleeding from the right lip. She offers no additional history due to her history of dementia.  Patient is a 80 y.o. female presenting with fall. The history is provided by the patient and the nursing home.  Fall This is a new problem. The current episode started less than 1 hour ago. The problem occurs constantly. The problem has not changed since onset.Nothing aggravates the symptoms. Nothing relieves the symptoms.    Past Medical History  Diagnosis Date  . Stroke   . Dementia   . HTN (hypertension)   . Hyperlipidemia   . GERD (gastroesophageal reflux disease)   . Gout   . Depression   . Renal failure   . Alzheimer's dementia    Past Surgical History  Procedure Laterality Date  . Cholecystectomy    . Total abdominal hysterectomy    . Cataract extraction w/ intraocular lens implant    . Laminectomy     Family History  Problem Relation Age of Onset  . Hypertension Mother   . Hypertension Father    History  Substance Use Topics  . Smoking status: Never Smoker   . Smokeless tobacco: Never Used  . Alcohol Use: No   OB History   Grav Para Term Preterm Abortions TAB SAB Ect Mult Living                 Review of Systems  Unable to perform ROS   Allergies  Review of patient's allergies indicates no known allergies.  Home Medications   Current Outpatient Rx  Name  Route  Sig  Dispense  Refill  . allopurinol (ZYLOPRIM) 100 MG tablet   Oral   Take 100 mg by mouth every  morning.          . bisacodyl (DULCOLAX) 10 MG suppository   Rectal   Place 10 mg rectally 2 (two) times daily as needed for moderate constipation.         . cloNIDine (CATAPRES - DOSED IN MG/24 HR) 0.3 mg/24hr   Transdermal   Place 1 patch (0.3 mg total) onto the skin every 7 (seven) days.   4 patch   3   . Cranberry 200 MG CAPS   Oral   Take 400 mg by mouth every morning.         . docusate (COLACE) 60 MG/15ML syrup   Oral   Take 40 mg by mouth 2 (two) times daily.         . feeding supplement (ENSURE IMMUNE HEALTH) LIQD   Oral   Take 237 mLs by mouth daily.         . Fluticasone-Salmeterol (ADVAIR DISKUS) 100-50 MCG/DOSE AEPB   Inhalation   Inhale 1 puff into the lungs 2 (two) times daily.         Marland Kitchen guaifenesin (ROBAFEN) 100 MG/5ML syrup   Oral   Take 100 mg by mouth every 4 (four) hours as needed for cough.         . hydrALAZINE (APRESOLINE) 50 MG  tablet   Oral   Take 75 mg by mouth daily.          Marland Kitchen. ipratropium-albuterol (DUONEB) 0.5-2.5 (3) MG/3ML SOLN   Nebulization   Take 3 mLs by nebulization every 4 (four) hours as needed (shortness of breath or wheezing).          . labetalol (NORMODYNE) 200 MG tablet   Oral   Take 1 tablet (200 mg total) by mouth 3 (three) times daily.   90 tablet   3   . levofloxacin (LEVAQUIN) 500 MG tablet   Oral   Take 500 mg by mouth daily.         Marland Kitchen. LORazepam (ATIVAN) 1 MG tablet   Oral   Take 1 mg by mouth 2 (two) times daily.         Marland Kitchen. LORazepam (ATIVAN) 1 MG tablet   Oral   Take 1 mg by mouth every 6 (six) hours as needed for anxiety.         Marland Kitchen. omeprazole (PRILOSEC) 40 MG capsule   Oral   Take 40 mg by mouth every morning.          Marland Kitchen. oxybutynin (DITROPAN-XL) 5 MG 24 hr tablet   Oral   Take 5 mg by mouth every morning.         . polyethylene glycol (MIRALAX / GLYCOLAX) packet   Oral   Take 17 g by mouth every morning.         Marland Kitchen. spironolactone (ALDACTONE) 25 MG tablet   Oral    Take 12.5 mg by mouth daily.          Temp(Src) 97.9 F (36.6 C) (Axillary)  Resp 19  Ht 5\' 4"  (1.626 m)  Wt 159 lb (72.122 kg)  BMI 27.28 kg/m2  SpO2 97% Physical Exam  Nursing note and vitals reviewed. Constitutional: She is oriented to person, place, and time.  Patient is an elderly female in no acute distress. She is awake and alert, however she is not oriented to person, place, or time.  HENT:  Head: Normocephalic.  There is slight blood in the corner of the right mouth with no obvious lacerations or oral trauma.  Eyes: EOM are normal. Pupils are equal, round, and reactive to light.  Neck: Normal range of motion. Neck supple.  Cardiovascular: Normal rate, regular rhythm and normal heart sounds.   No murmur heard. Pulmonary/Chest: Effort normal and breath sounds normal. No respiratory distress. She has no wheezes.  Abdominal: Soft. Bowel sounds are normal. She exhibits no distension. There is no tenderness.  Musculoskeletal: Normal range of motion. She exhibits no edema.  Lymphadenopathy:    She has no cervical adenopathy.  Neurological: She is alert and oriented to person, place, and time. No cranial nerve deficit.  Neurologic exam is limited due to the patient's noncompliance and dementia. She is awake and alert but is disoriented and inappropriate. She does move all 4 extremities spontaneously.  Skin: Skin is warm and dry.    ED Course  Procedures (including critical care time) Labs Review Labs Reviewed  CBC WITH DIFFERENTIAL  COMPREHENSIVE METABOLIC PANEL  URINALYSIS, ROUTINE W REFLEX MICROSCOPIC   Imaging Review No results found.  EKG Interpretation    Date/Time:  Thursday July 14 2013 10:34:17 EST Ventricular Rate:  82 PR Interval:  167 QRS Duration: 85 QT Interval:  396 QTC Calculation: 462 R Axis:   52 Text Interpretation:  Sinus or ectopic atrial rhythm Probable anteroseptal infarct, old  Confirmed by Malva Cogan  MD, Dmarcus Decicco ([id number]) on 07/14/2013 4:02:03  PM            MDM  No diagnosis found. Patient is an 80 year old female brought to the ER after a fall. She was at her extended care facility when she fell out of her wheelchair. Workup reveals a negative head CT and cervical spine CT. Laboratory studies show blood counts and electrolytes are essentially unremarkable. She does have evidence for a UTI. She is currently being treated for this and I will recommend continuing this antibiotic. She has been confused and uncooperative, however from what the family tells me what I read in the chart this appears to be her baseline. She will be discharged to home with a sleep aid she is to take at night at the request of the family.    Geoffery Lyons, MD 07/14/13 [phone]

## 2013-07-14 NOTE — ED Notes (Signed)
Pt's IV not working when she returned from radiology. Flushed IV and put a new dressing on it. Fluids flowing again.

## 2013-07-14 NOTE — ED Notes (Signed)
Family changed their minds, they want the pt to be admitted and want a full work up now.

## 2013-07-14 NOTE — ED Notes (Signed)
Pt keeps repeating "help me"

## 2014-07-26 ENCOUNTER — Encounter (HOSPITAL_COMMUNITY): Payer: Self-pay

## 2014-07-26 ENCOUNTER — Emergency Department (HOSPITAL_COMMUNITY)
Admission: EM | Admit: 2014-07-26 | Discharge: 2014-07-26 | Disposition: A | Discharge status: Home / Self Care | Attending: Emergency Medicine | Admitting: Emergency Medicine

## 2014-07-26 ENCOUNTER — Emergency Department (HOSPITAL_COMMUNITY)

## 2014-07-26 DIAGNOSIS — Y9389 Activity, other specified: Secondary | ICD-10-CM | POA: Insufficient documentation

## 2014-07-26 DIAGNOSIS — Y998 Other external cause status: Secondary | ICD-10-CM | POA: Insufficient documentation

## 2014-07-26 DIAGNOSIS — W19XXXA Unspecified fall, initial encounter: Secondary | ICD-10-CM

## 2014-07-26 DIAGNOSIS — F329 Major depressive disorder, single episode, unspecified: Secondary | ICD-10-CM | POA: Diagnosis not present

## 2014-07-26 DIAGNOSIS — S0101XA Laceration without foreign body of scalp, initial encounter: Secondary | ICD-10-CM | POA: Insufficient documentation

## 2014-07-26 DIAGNOSIS — W06XXXA Fall from bed, initial encounter: Secondary | ICD-10-CM | POA: Diagnosis not present

## 2014-07-26 DIAGNOSIS — Z79899 Other long term (current) drug therapy: Secondary | ICD-10-CM | POA: Insufficient documentation

## 2014-07-26 DIAGNOSIS — Y92122 Bedroom in nursing home as the place of occurrence of the external cause: Secondary | ICD-10-CM | POA: Diagnosis not present

## 2014-07-26 DIAGNOSIS — M109 Gout, unspecified: Secondary | ICD-10-CM | POA: Diagnosis not present

## 2014-07-26 DIAGNOSIS — F0280 Dementia in other diseases classified elsewhere without behavioral disturbance: Secondary | ICD-10-CM | POA: Insufficient documentation

## 2014-07-26 DIAGNOSIS — E785 Hyperlipidemia, unspecified: Secondary | ICD-10-CM | POA: Diagnosis not present

## 2014-07-26 DIAGNOSIS — I10 Essential (primary) hypertension: Secondary | ICD-10-CM | POA: Insufficient documentation

## 2014-07-26 DIAGNOSIS — G309 Alzheimer's disease, unspecified: Secondary | ICD-10-CM | POA: Diagnosis not present

## 2014-07-26 DIAGNOSIS — Z87448 Personal history of other diseases of urinary system: Secondary | ICD-10-CM | POA: Diagnosis not present

## 2014-07-26 DIAGNOSIS — Z792 Long term (current) use of antibiotics: Secondary | ICD-10-CM | POA: Diagnosis not present

## 2014-07-26 DIAGNOSIS — K219 Gastro-esophageal reflux disease without esophagitis: Secondary | ICD-10-CM | POA: Insufficient documentation

## 2014-07-26 DIAGNOSIS — Z7951 Long term (current) use of inhaled steroids: Secondary | ICD-10-CM | POA: Diagnosis not present

## 2014-07-26 MED ORDER — ACETAMINOPHEN 500 MG PO TABS
1000.0000 mg | ORAL_TABLET | Freq: Once | ORAL | Status: AC
  Administered 2014-07-26: 1000 mg via ORAL
  Filled 2014-07-26: qty 2

## 2014-07-26 MED ORDER — LIDOCAINE-EPINEPHRINE (PF) 2 %-1:200000 IJ SOLN
10.0000 mL | Freq: Once | INTRAMUSCULAR | Status: DC
  Filled 2014-07-26: qty 20

## 2014-07-26 MED ORDER — ACETAMINOPHEN 325 MG PO TABS: 650.0000 mg | ORAL_TABLET | Freq: Four times a day (QID) | ORAL | Status: AC | PRN

## 2014-07-26 NOTE — ED Notes (Signed)
PA Student at bedside for lac repair.

## 2014-07-26 NOTE — Discharge Instructions (Signed)
Fall Prevention and Home Safety Falls cause injuries and can affect all age groups. It is possible to use preventive measures to significantly decrease the likelihood of falls. There are many simple measures which can make your home safer and prevent falls. OUTDOORS  Repair cracks and edges of walkways and driveways.  Remove high doorway thresholds.  Trim shrubbery on the main path into your home.  Have good outside lighting.  Clear walkways of tools, rocks, debris, and clutter.  Check that handrails are not broken and are securely fastened. Both sides of steps should have handrails.  Have leaves, snow, and ice cleared regularly.  Use sand or salt on walkways during winter months.  In the garage, clean up grease or oil spills. BATHROOM  Install night lights.  Install grab bars by the toilet and in the tub and shower.  Use non-skid mats or decals in the tub or shower.  Place a plastic non-slip stool in the shower to sit on, if needed.  Keep floors dry and clean up all water on the floor immediately.  Remove soap buildup in the tub or shower on a regular basis.  Secure bath mats with non-slip, double-sided rug tape.  Remove throw rugs and tripping hazards from the floors. BEDROOMS  Install night lights.  Make sure a bedside light is easy to reach.  Do not use oversized bedding.  Keep a telephone by your bedside.  Have a firm chair with side arms to use for getting dressed.  Remove throw rugs and tripping hazards from the floor. KITCHEN  Keep handles on pots and pans turned toward the center of the stove. Use back burners when possible.  Clean up spills quickly and allow time for drying.  Avoid walking on wet floors.  Avoid hot utensils and knives.  Position shelves so they are not too high or low.  Place commonly used objects within easy reach.  If necessary, use a sturdy step stool with a grab bar when reaching.  Keep electrical cables out of the  way.  Do not use floor polish or wax that makes floors slippery. If you must use wax, use non-skid floor wax.  Remove throw rugs and tripping hazards from the floor. STAIRWAYS  Never leave objects on stairs.  Place handrails on both sides of stairways and use them. Fix any loose handrails. Make sure handrails on both sides of the stairways are as long as the stairs.  Check carpeting to make sure it is firmly attached along stairs. Make repairs to worn or loose carpet promptly.  Avoid placing throw rugs at the top or bottom of stairways, or properly secure the rug with carpet tape to prevent slippage. Get rid of throw rugs, if possible.  Have an electrician put in a light switch at the top and bottom of the stairs. OTHER FALL PREVENTION TIPS  Wear low-heel or rubber-soled shoes that are supportive and fit well. Wear closed toe shoes.  When using a stepladder, make sure it is fully opened and both spreaders are firmly locked. Do not climb a closed stepladder.  Add color or contrast paint or tape to grab bars and handrails in your home. Place contrasting color strips on first and last steps.  Learn and use mobility aids as needed. Install an electrical emergency response system.  Turn on lights to avoid dark areas. Replace light bulbs that burn out immediately. Get light switches that glow.  Arrange furniture to create clear pathways. Keep furniture in the same place.  Firmly attach carpet with non-skid or double-sided tape.  Eliminate uneven floor surfaces.  Select a carpet pattern that does not visually hide the edge of steps.  Be aware of all pets. OTHER HOME SAFETY TIPS  Set the water temperature for 120 F (48.8 C).  Keep emergency numbers on or near the telephone.  Keep smoke detectors on every level of the home and near sleeping areas. Document Released: 05/23/2002 Document Revised: 12/02/2011 Document Reviewed: 08/22/2011 [facility] Patient Information 2015  [place], [place]. This information is not intended to replace advice given to you by your health care provider. Make sure you discuss any questions you have with your health care provider. Absorbable Suture Repair Absorbable sutures (stitches) hold skin together so you can heal. Keep skin wounds clean and dry for the next 2 to 3 days. Then, you may gently wash your wound and dress it with an antibiotic ointment as recommended. As your wound begins to heal, the sutures are no longer needed, and they typically begin to fall off. This will take 7 to 10 days. After 10 days, if your sutures are loose, you can remove them by wiping with a clean gauze pad or a cotton ball. Do not pull your sutures out. They should wipe away easily. If after 10 days they do not easily wipe away, have your caregiver take them out. Absorbable sutures may be used deep in a wound to help hold it together. If these stitches are below the skin, the body will absorb them completely in 3 to 4 weeks.  You may need a tetanus shot if:  You cannot remember when you had your last tetanus shot.  You have never had a tetanus shot. If you get a tetanus shot, your arm may swell, get red, and feel warm to the touch. This is common and not a problem. If you need a tetanus shot and you choose not to have one, there is a rare chance of getting tetanus. Sickness from tetanus can be serious. SEEK IMMEDIATE MEDICAL CARE IF:  You have redness in the wound area.  The wound area feels hot to the touch.  You develop swelling in the wound area.  You develop pain.  There is fluid drainage from the wound. Document Released: 07/10/2004 Document Revised: 08/25/2011 Document Reviewed: 10/22/2010 [facility] Patient Information 2015 [place], [place]. This information is not intended to replace advice given to you by your health care provider. Make sure you discuss any questions you have with your health care provider.

## 2014-07-26 NOTE — ED Notes (Signed)
Patient transported to CT 

## 2014-07-26 NOTE — ED Provider Notes (Signed)
CSN: [id number]     Arrival date & time 07/26/14  0548 History   First MD Initiated Contact with Patient 07/26/14 [id number]     Chief Complaint  Patient presents with  . Fall     (Consider location/radiation/quality/duration/timing/severity/associated sxs/prior Treatment) HPI Comments: Patient with PMH of stroke, dementia, HTN, HL, renal failure, presents to the ED with a chief complaint of fall.  She states that she fell out of her bed at the nursing home this morning.  She is uncertain why she fell.  Does not take blood thinners. She denies any LOC.  She denies any pain at this time.  Hx limited 2/2 dementia.  Level 5 caveat applies.  The history is provided by the patient. No language interpreter was used.    Past Medical History  Diagnosis Date  . Stroke   . Dementia   . HTN (hypertension)   . Hyperlipidemia   . GERD (gastroesophageal reflux disease)   . Gout   . Depression   . Renal failure   . Alzheimer's dementia    Past Surgical History  Procedure Laterality Date  . Cholecystectomy    . Total abdominal hysterectomy    . Cataract extraction w/ intraocular lens implant    . Laminectomy     Family History  Problem Relation Age of Onset  . Hypertension Mother   . Hypertension Father    History  Substance Use Topics  . Smoking status: Never Smoker   . Smokeless tobacco: Never Used  . Alcohol Use: No   OB History    No data available     Review of Systems  Unable to perform ROS: Dementia      Allergies  Review of patient's allergies indicates no known allergies.  Home Medications   Prior to Admission medications   Medication Sig Start Date End Date Taking? Authorizing Provider  allopurinol (ZYLOPRIM) 100 MG tablet Take 100 mg by mouth every morning.     Historical Provider, MD  bisacodyl (DULCOLAX) 10 MG suppository Place 10 mg rectally 2 (two) times daily as needed for moderate constipation.    Historical Provider, MD  cloNIDine (CATAPRES - DOSED IN MG/24  HR) 0.3 mg/24hr Place 1 patch (0.3 mg total) onto the skin every 7 (seven) days. 10/29/12   Maryruth Bun Rama, MD  Cranberry 200 MG CAPS Take 400 mg by mouth every morning.    Historical Provider, MD  docusate (COLACE) 60 MG/15ML syrup Take 40 mg by mouth 2 (two) times daily.    Historical Provider, MD  feeding supplement (ENSURE IMMUNE HEALTH) LIQD Take 237 mLs by mouth daily.    Historical Provider, MD  Fluticasone-Salmeterol (ADVAIR DISKUS) 100-50 MCG/DOSE AEPB Inhale 1 puff into the lungs 2 (two) times daily.    Historical Provider, MD  guaifenesin (ROBAFEN) 100 MG/5ML syrup Take 100 mg by mouth every 4 (four) hours as needed for cough.    Historical Provider, MD  hydrALAZINE (APRESOLINE) 50 MG tablet Take 75 mg by mouth daily.     Historical Provider, MD  ipratropium-albuterol (DUONEB) 0.5-2.5 (3) MG/3ML SOLN Take 3 mLs by nebulization every 4 (four) hours as needed (shortness of breath or wheezing).     Historical Provider, MD  labetalol (NORMODYNE) 200 MG tablet Take 1 tablet (200 mg total) by mouth 3 (three) times daily. 10/24/12   Maryruth Bun Rama, MD  levofloxacin (LEVAQUIN) 500 MG tablet Take 500 mg by mouth daily.    Historical Provider, MD  LORazepam (ATIVAN) 1 MG  tablet Take 1 mg by mouth 2 (two) times daily.    Historical Provider, MD  LORazepam (ATIVAN) 1 MG tablet Take 1 mg by mouth every 6 (six) hours as needed for anxiety.    Historical Provider, MD  omeprazole (PRILOSEC) 40 MG capsule Take 40 mg by mouth every morning.     Historical Provider, MD  oxybutynin (DITROPAN-XL) 5 MG 24 hr tablet Take 5 mg by mouth every morning.    Historical Provider, MD  polyethylene glycol (MIRALAX / GLYCOLAX) packet Take 17 g by mouth every morning.    Historical Provider, MD  spironolactone (ALDACTONE) 25 MG tablet Take 12.5 mg by mouth daily.    Historical Provider, MD  zolpidem (AMBIEN) 5 MG tablet Take 1 tablet (5 mg total) by mouth at bedtime as needed for sleep. 07/14/13   Geoffery Lyons, MD   BP  155/63 mmHg  Pulse 63  Temp(Src) 97.4 F (36.3 C) (Oral)  Resp 16  SpO2 100% Physical Exam  Constitutional: She is oriented to person, place, and time. She appears well-developed and well-nourished.  HENT:  Head: Normocephalic and atraumatic.  Eyes: Conjunctivae and EOM are normal. Pupils are equal, round, and reactive to light.  Neck: Normal range of motion. Neck supple.  Cardiovascular: Normal rate and regular rhythm.  Exam reveals no gallop and no friction rub.   No murmur heard. Pulmonary/Chest: Effort normal and breath sounds normal. No respiratory distress. She has no wheezes. She has no rales. She exhibits no tenderness.  Abdominal: Soft. Bowel sounds are normal. She exhibits no distension and no mass. There is no tenderness. There is no rebound and no guarding.  No focal abdominal tenderness, no RLQ tenderness or pain at McBurney's point, no RUQ tenderness or Murphy's sign, no left-sided abdominal tenderness, no fluid wave, or signs of peritonitis   Musculoskeletal: Normal range of motion. She exhibits no edema or tenderness.  No CTLS spine tenderness, step-off, or deformity, no pain with passive ROM of extremities, strength grossly intact  Neurological: She is alert and oriented to person, place, and time.  Skin: Skin is warm and dry.  2 cm laceration to scalp, bleeding controlled  Psychiatric: She has a normal mood and affect. Her behavior is normal. Judgment and thought content normal.  Nursing note and vitals reviewed.   ED Course  Procedures (including critical care time) Labs Review Labs Reviewed - No data to display  Imaging Review Ct Head Wo Contrast  07/26/2014   CLINICAL DATA:  Initial evaluation for acute trauma.  Fall.  EXAM: CT HEAD WITHOUT CONTRAST  CT CERVICAL SPINE WITHOUT CONTRAST  TECHNIQUE: Multidetector CT imaging of the head and cervical spine was performed following the standard protocol without intravenous contrast. Multiplanar CT image reconstructions  of the cervical spine were also generated.  COMPARISON:  Prior study from 07/14/2013  FINDINGS: CT HEAD FINDINGS  Diffuse prominence of the CSF containing spaces is compatible with generalized cerebral atrophy. Patchy and confluent hypodensity within the periventricular deep white matter both cerebral hemispheres most consistent with chronic small vessel ischemic disease. Few scattered remote lacunar infarcts present within the basal ganglia. Prominent vascular calcifications present within the carotid siphons.  No acute intracranial hemorrhage or infarct. No extra-axial fluid collection. Ventricles normal size without evidence of hydrocephalus.  Scalp soft tissues within normal limits. No acute abnormality seen about the orbits.  Calvarium intact. No skull fracture. 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  Mild scattered  mucoperiosteal thickening present within the ethmoidal air cells. No air-fluid levels within the paranasal sinuses. Mastoid air cells well pneumatized.  CT CERVICAL SPINE FINDINGS  There is straightening of the normal cervical lordosis, which may be related to patient positioning. Vertebral body heights are preserved. Normal C1-2 articulations are intact. No prevertebral soft tissue swelling. No acute fracture or listhesis.  Moderate multilevel degenerative disc disease is evidenced by intervertebral disc space narrowing, endplate sclerosis common osteophytosis present, most severe at C5-6 and C6-7. Prominent degenerative changes present about the anterior C1-2 articulation. Multilevel facet arthropathy present.  Visualized soft tissues of the neck are within normal limits. No apical pneumothorax. Irregular biapical pleural thickening noted. Prominent vascular calcifications noted about the carotid bifurcations.  IMPRESSION: CT BRAIN:  1. No acute intracranial process. 2. Generalized cerebral atrophy with chronic microvascular ischemic  disease. 3. Stable changes within the left frontal calvarium and left sphenoid wing, most consistent with fibrous dysplasia.  CT CERVICAL SPINE:  No acute traumatic injury within the cervical spine.   Electronically Signed   By: Rise MuBenjamin  McClintock M.D.   On: 07/26/2014 06:59   Ct Cervical Spine Wo Contrast  07/26/2014   CLINICAL DATA:  Initial evaluation for acute trauma.  Fall.  EXAM: CT HEAD WITHOUT CONTRAST  CT CERVICAL SPINE WITHOUT CONTRAST  TECHNIQUE: Multidetector CT imaging of the head and cervical spine was performed following the standard protocol without intravenous contrast. Multiplanar CT image reconstructions of the cervical spine were also generated.  COMPARISON:  Prior study from 07/14/2013  FINDINGS: CT HEAD FINDINGS  Diffuse prominence of the CSF containing spaces is compatible with generalized cerebral atrophy. Patchy and confluent hypodensity within the periventricular deep white matter both cerebral hemispheres most consistent with chronic small vessel ischemic disease. Few scattered remote lacunar infarcts present within the basal ganglia. Prominent vascular calcifications present within the carotid siphons.  No acute intracranial hemorrhage or infarct. No extra-axial fluid collection. Ventricles normal size without evidence of hydrocephalus.  Scalp soft tissues within normal limits. No acute abnormality seen about the orbits.  Calvarium intact. No skull fracture. 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  Mild scattered mucoperiosteal thickening present within the ethmoidal air cells. No air-fluid levels within the paranasal sinuses. Mastoid air cells well pneumatized.  CT CERVICAL SPINE FINDINGS  There is straightening of the normal cervical lordosis, which may be related to patient positioning. Vertebral body heights are preserved. Normal C1-2 articulations are intact. No prevertebral soft tissue swelling. No acute fracture or  listhesis.  Moderate multilevel degenerative disc disease is evidenced by intervertebral disc space narrowing, endplate sclerosis common osteophytosis present, most severe at C5-6 and C6-7. Prominent degenerative changes present about the anterior C1-2 articulation. Multilevel facet arthropathy present.  Visualized soft tissues of the neck are within normal limits. No apical pneumothorax. Irregular biapical pleural thickening noted. Prominent vascular calcifications noted about the carotid bifurcations.  IMPRESSION: CT BRAIN:  1. No acute intracranial process. 2. Generalized cerebral atrophy with chronic microvascular ischemic disease. 3. Stable changes within the left frontal calvarium and left sphenoid wing, most consistent with fibrous dysplasia.  CT CERVICAL SPINE:  No acute traumatic injury within the cervical spine.   Electronically Signed   By: Rise MuBenjamin  McClintock M.D.   On: 07/26/2014 06:59     EKG Interpretation None     LACERATION REPAIR Performed by: Roxy HorsemanBROWNING, Teruo Stilley Authorized by: Roxy HorsemanBROWNING, Shereese Bonnie Consent: Verbal consent obtained. Risks and benefits: risks, benefits and alternatives were  discussed Consent given by: patient Patient identity confirmed: provided demographic data Prepped and Draped in normal sterile fashion Wound explored  Laceration Location: scalp  Laceration Length: 4 cm  No Foreign Bodies seen or palpated  Anesthesia: local infiltration  Local anesthetic: lidocaine 2% with epinephrine  Anesthetic total: 5 ml  Irrigation method: syringe Amount of cleaning: standard  Skin closure: 5  Number of sutures: 4-0 vicryl rapide  Technique: interrupted  Patient tolerance: Patient tolerated the procedure well with no immediate complications.  MDM   Final diagnoses:  Fall, initial encounter  Scalp laceration, initial encounter    Patient with fall out of bed.  Laceration noted to top of head.  Will repair with dissolvable suture.  Will get imaging of  head and neck.  Patient seen by and discussed with Dr. Judd Lien.  7:13 AM Imaging is negative.  DC to home with PCP follow-up.   Filed Vitals:   07/26/14 0558  BP: 155/63  Pulse: 63  Temp: 97.4 F (36.3 C)  Resp: [address] PA-C 07/26/14 0720  Geoffery Lyons, MD 07/26/14 4098  Geoffery Lyons, MD 07/26/14 2318

## 2014-07-26 NOTE — ED Notes (Signed)
PER EMS: pt from nursing home, pt reports she fell out of her bed this morning and was found by staff in the floor. Laceration to top of head, bleeding controlled. Hx of dementia, not alert to time but patient at baseline according to EMS.

## 2014-10-08 NOTE — Consult Note (Signed)
PATIENT NAME:  Angela Cervantes, Angela Cervantes MR#:  [id number] DATE OF BIRTH:  [birth year]  DATE OF CONSULTATION:  08/25/2011  REFERRING PHYSICIAN:  Aram BeechamJeffrey Sparks, MD CONSULTING PHYSICIAN:  Lurline DelShaukat Abdirahman Chittum, MD  REASON FOR CONSULTATION: Heme-positive stool.   HISTORY OF PRESENT ILLNESS: The patient is an 81 year old female with history of recurrent urinary tract infection, dementia, hypertension. The patient was admitted yesterday when she presented to the Emergency Room with lethargy, mental status changes. The patient was found to be febrile with leukocytosis. Further work-up showed left lower lobe pneumonia, and the patient was admitted for further management of her metabolic encephalopathy secondary to an acute infection. The patient also had a stool test done for occult blood which was positive, and Gastroenterology was consulted. The patient herself is unable to give any history, but according to her son and daughter-in-law the patient has not complained of any GI issues recently. There has been no nausea, vomiting, abdominal pain, diarrhea, constipation, or rectal bleeding. The patient apparently had a colonoscopy about a year ago in [place], and that was unremarkable according to the son.   PAST MEDICAL HISTORY:  1. Alzheimer's dementia. 2. Recurrent urinary tract infection. 3. Benign hypertension.  4. Osteoarthritis.  5. Depression.  6. Cerebrovascular accident. 7. Urinary incontinence. 8. Gout. 9. Gastroesophageal reflux disease.  10. Hyperlipidemia.  11. Osteoarthritis.Marland Kitchen.   HOME MEDICATIONS: Zocor, Risperdal, Prilosec, multivitamin, MiraLAX, Zestril, Levaquin, Lasix, hydralazine, Detrol, Colace, Catapres, aspirin, Aricept, Norvasc and allopurinol.   ALLERGIES: None.   SOCIAL HISTORY: She does not smoke or drink.   FAMILY HISTORY: Unremarkable.   REVIEW OF SYSTEMS: Review of systems is not obtainable.   PHYSICAL EXAMINATION:  GENERAL: Elderly female. She makes eye contact but is  verbally noncommunicative. She does not appear to be in any acute distress. She does not appear to be septic or toxic, clinically does not appear to be anemic.   VITAL SIGNS: Vital signs are stable. Temperature 99.7, pulse 88, respirations 20, blood pressure 100/56.   LUNGS: Grossly clear to auscultation bilaterally with fair air entry and no added sounds.   CARDIOVASCULAR: Regular rate and rhythm. A loud 3/6 systolic murmur was heard.   ABDOMEN: Abdomen is quite soft and benign. Bowel sounds are positive. Nontender. No hepatosplenomegaly or ascites was noted.   NEUROLOGICAL: Examination is difficult to perform. As mentioned above, the patient makes eye contact but does not communicate verbally.   LABORATORY, DIAGNOSTIC AND RADIOLOGICAL DATA:  Her INR is 1.3.  Hemoglobin is normal at 13.1.  White cell count is elevated at 15,000.  Troponin is 0.04. Sodium 153, potassium 5.1, chloride 119, CO2 17, creatinine is 1.44. Liver enzymes are normal.   CT of the head is unremarkable.  Chest x-ray showed atelectasis versus infiltrate in the left lower lobe.   ASSESSMENT AND PLAN: Patient with left lower lobe pneumonia, metabolic encephalopathy, advanced age, dementia. The patient was found to have heme-positive stool. There are no gastrointestinal symptoms. There is no history of bright red blood per rectum or melena, and the patient apparently had a colonoscopy recently which was unremarkable. Heme-positive stool test was developed to screen healthy individuals, and its utility in sick hospitalized individuals is not known and is probably of no use in such patient population. As there are no gastrointestinal symptoms reported by the family members, there are no signs of rectal bleeding or melena, and her hemoglobin and hematocrit is normal, no further GI work-up is indicated at this point. The patient can follow as outpatient with  her gastroenterologist for this heme-positive stool, although I doubt that  any further GI work-up will be initiated as she has had a colonoscopy recently, and she is overall in a very poor physical condition. If there are signs of active GI bleeding or a significant drop in her hemoglobin or hematocrit, please do not hesitate to reconsult; otherwise, we will sign off as we have no further recommendations at this point.  ____________________________ Lurline Del, MD si:[id number] D: 08/25/2011 19:10:44 ET T: 08/26/2011 11:38:01 ET JOB#: [id number]  cc: Lurline Del, MD, <Dictator> Barbette Reichmann, MD Lurline Del MD ELECTRONICALLY SIGNED 08/30/2011 11:24

## 2014-10-08 NOTE — H&P (Signed)
PATIENT NAME:  Angela Cervantes, Angela Cervantes MR#:  [id number] DATE OF BIRTH:  Dec 26, 1921  DATE OF ADMISSION:  08/24/2011  REFERRING PHYSICIAN: Joseph Art, MD  PRIMARY CARE PHYSICIAN: Ginette A. Archinal, MD  REASON FOR ADMISSION: Altered mental status.   HISTORY OF PRESENT ILLNESS: The patient is an 81 year old female with a history of recurrent urinary tract infections, dementia, and hypertension who presents to the Emergency Room with worsening lethargy and mental status changes, from the skilled nursing facility. In the Emergency Room, the patient was found to be febrile with leukocytosis. She was also noted to be dehydrated with acute renal failure. She was also noted to have guaiac-positive stools. She is unable to give a history. No family is present. She is now admitted for further evaluation.   PAST MEDICAL HISTORY:  1. Alzheimer's dementia.  2. Recurrent urinary tract infections.  3. Benign hypertension.  4. Atherosclerotic cardiovascular disease. 5. Osteoarthritis.  6. Depression.  7. Previous stroke.  8. Stress urinary incontinence.  9. Gout.  10. Gastroesophageal reflux disease.  11. Hyperlipidemia.  12. Osteoarthritis.   MEDICATIONS:  1. Zocor 20 mg p.o. daily.  2. Risperdal 0.5 mg p.o. every a.m.  3. Prilosec 40 mg p.o. daily.  4. Multivitamin 1 p.o. daily.  5. MiraLax 17 grams p.o. daily.  6. Zestril 20 mg p.o. daily.  7. Levaquin 250 mg p.o. daily.  8. Lasix 10 mg p.o. daily.  9. Hydralazine 75 mg p.o. four times daily. 10. Detrol 2 mg p.o. daily. 11. Colace 100 mg p.o. twice a day.  12. Catapres TTS-2 one patch topically every week.  13. Aspirin 81 mg p.o. daily.  14. Aricept 5 mg p.o. at bedtime.  15. Norvasc 10 mg p.o. daily.  16. Allopurinol 100 mg p.o. daily.   ALLERGIES: No known drug allergies.   SOCIAL HISTORY: There is no apparent history of alcohol or tobacco abuse.   FAMILY HISTORY: Unable to obtain.   REVIEW OF SYSTEMS: Unable to obtain.   PHYSICAL  EXAMINATION:   GENERAL: The patient is chronically ill-appearing but in no acute distress.   VITAL SIGNS: Vital signs are currently remarkable for a blood pressure of 180/98 with a heart rate of 86 and a respiratory rate of 24. Temperature is 99.4. Oxygen saturation is 97% on room air.   HEENT: Normocephalic, atraumatic. Pupils are equally round and reactive to light and accommodation. Extraocular movements are intact. Sclerae are anicteric. Conjunctivae are clear. Oropharynx is extremely dry and erythematous.   NECK: Supple without jugular venous distention. No adenopathy or thyromegaly was noted.   LUNGS: Essentially clear to auscultation and percussion without wheezes, rales, or rhonchi. No dullness. Respiratory effort is normal.   CARDIAC: Regular rate and rhythm, normal S1 and S2. There is a 2/6 systolic murmur noted throughout the precordium. No rubs or gallops are present.   ABDOMEN: Soft and nontender with normoactive bowel sounds. No organomegaly or masses were appreciated. No hernias or bruits were noted.   EXTREMITIES: No clubbing, cyanosis, or edema. Pulses were 1+ bilaterally.   SKIN: Warm and dry without rash or lesions.   NEUROLOGIC: Cranial nerves II through XII grossly intact. Deep tendon reflexes were symmetric. Motor and sensory exam is nonfocal.   PSYCH: Examination revealed a patient who is nonverbal, but would nod occasionally to questions, but could not follow commands.   LABS/STUDIES: Troponin was elevated at 0.06. Glucose was 119 with a BUN of 74 and a creatinine of 1.71 with a sodium of 152 and a  potassium of 3.6. Her GFR was 36. White count was 20.8 with a hemoglobin of 12.3.   Head CT revealed no acute abnormalities.   EKG revealed sinus rhythm with no acute ischemic changes.   Chest x-ray revealed atelectasis at both lung bases.   ASSESSMENT:  1. Presumed recurrent urinary tract infection.  2. Acute renal failure with dehydration.  3. Hypernatremia.   4. Guaiac-positive stools.  5. Metabolic encephalopathy.  6. Alzheimer's dementia.  7. Benign hypertension.  8. Previous stroke.   PLAN: The patient will be admitted to the floor with telemetry. Because of her mild elevation of troponin and her cardiac murmur, we will obtain an echocardiogram and follow serial cardiac enzymes at this time. We will begin IV fluids for hydration. We will send off blood and urine cultures. We will begin IV antibiotics as well. We will guaiac all stools and follow her hemoglobin closely. We will consult Gastroenterology in regards to her guaiac-positive stools. Because of the patient's mental status and inability to articulate her issues, we will proceed with physical therapy and speech therapy consult. We will also obtain a palliative care consult in the morning for long-term goals as she is currently a FULL CODE. We will monitor her renal function and sodium status closely with hydration. Routine labs in the morning. At this time, she is a FULL CODE. Further treatment and evaluation will depend upon the patient's progress.   TOTAL TIME SPENT: 50 minutes.  ____________________________ Duane LopeJeffrey D. Judithann SheenSparks, MD jds:slb D: 08/24/2011 19:18:59 ET T: 08/25/2011 07:20:37 ET JOB#: [id number]  cc: Duane LopeJeffrey D. Judithann SheenSparks, MD, <Dictator> Ginette A. Andrey SpearmanArchinal, MD Kennice Finnie Rodena Medin Wister Hoefle MD ELECTRONICALLY SIGNED 08/26/2011 14:41

## 2014-10-08 NOTE — Discharge Summary (Signed)
PATIENT NAME:  Angela Cervantes, Angela Cervantes MR#:  [id number] DATE OF BIRTH:  1922-04-27  DATE OF ADMISSION:  07/18/2011 DATE OF DISCHARGE:  07/22/2011   ADMITTING PHYSICIAN: Alounthith Phichith, MD   DISCHARGING PHYSICIAN: Enid Baasadhika Leilynn Pilat, MD   PRIMARY MD: Dr. Glade LloydPandey at [facility]   CONSULTATION IN THE HOSPITAL: Speech/swallow pathologist consultation by Natalia LeatherwoodKatherine    DISCHARGE DIAGNOSES:  1. Altered mental status while in the hospital secondary to worsening dementia and also trazodone and Klonopin which caused lethargy.  2. Progressively worsening dementia with behavioral problems with periods of agitation.  3. Hyponatremia, improved now.  4. Klebsiella urinary tract infection.  5. Hypertension.  6. Coronary artery disease with elevated troponins.  7. Osteoarthritis.  8. Depression.  9. Prior history of cerebrovascular accident.  DISCHARGE MEDICATIONS:  1. Aspirin 81 mg p.o. daily.  2. Omeprazole 40 mg p.o. daily.  3. Simvastatin 20 mg p.o. daily.  4. Allopurinol 100 mg p.o. daily.  5. Colace 100 mg p.o. b.i.d.  6. Lisinopril 20 mg p.o. daily.  7. Singulair 10 mg p.o. daily.  8. Norvasc 10 mg p.o. daily.  9. Triamcinolone acetonide topically as needed for itching on the skin.  10. Detrol 2 mg p.o. daily.  11. Hydralazine 75 mg p.o. q.6 hours.  12. Risperidone 0.5 mg oral disintegrating tablet p.o. at bedtime.  13. Risperidone 0.5 mg oral disintegrating tablet p.o. daily in the morning p.r.n. for agitation.   14. Ativan 0.5 mg p.o. q.8 hours p.r.n. for agitation.  15. Clonidine 0.2 mg transdermal weekly patch.  16. Levaquin 500 mg p.o. daily until 07/27/2011.  17. Aricept 5mg  PO qdaily  DISCHARGE DIET: Puree diet.   DISCHARGE ACTIVITY: As tolerated.   FOLLOW-UP INSTRUCTIONS:  1. PCP follow-up in 1 to 2 weeks.  2. Physical therapy.  3. Outpatient sleep swallow pathology evaluation in 2 to 3 weeks.   LABS AT THE TIME OF DISCHARGE: WBC 6.2, hemoglobin 10.8, hematocrit 33.6,  platelet count 209, sodium 135, potassium 4.1, chloride 101, bicarb 20, BUN 12, creatinine 0.85, glucose 97, calcium 9.4. 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 There is generalized cerebral atrophy noted and periventricular white matter changes secondary to microangiopathy seen. Sodium at the time of admission was 127. Troponin has been elevated as high as 0.12 and has remained the same. Urinalysis showing 3+ leukocyte esterase with 1+ bacteria and 58 WBCs. Urine culture is growing greater than 100,000 colonies of Klebsiella pneumonia which is sensitive to fluoroquinolone and Rocephin. CT of the head on admission showed sphenoid sinusitis, stable bony lesion in the left lateral orbital wall and no acute intracranial abnormality. Chest x-ray on admission showing shallow inspiration. No focal infiltrate, effusion, or edema seen. No evidence of any acute cardiopulmonary disease. Sodium on admission was 125.   BRIEF HOSPITAL COURSE: Ms. Angela Cervantes is an 81 year old elderly African American female with past medical history significant for dementia, cerebrovascular accident, and hypertension who was living at home with her son and daughter-in-law was brought in secondary to subacute change in her mental status and urinary incontinence.  1. Altered mental status on presentation with poor p.o. intake partly secondary to progressively worsening dementia and also hyponatremia. Her sodium was found to be in 125. That was corrected with fluid restriction and also IV normal saline. She does have a past history of psychogenic polydipsia. On current mental status, she remains alert and answers questions with yes or  no, pleasantly smiles at you, but is not oriented at all. She has periods of agitation where she hits other people and also periods where she prefers not to talk to anybody also. CT of the head done twice did not  show any acute changes so probably this is all gradually worsening dementia. She did have urinary tract infection that was treated as described below. She had an episode where she was more sleepy one morning and it was noticed that she got the Klonopin p.r.n. ordered and also a trazodone in the morning. She has been on these medications as an outpatient but because of this new event of more sleepiness, those medications were suspended. She was placed on a low dose risperidone at bedtime to help with agitation during the nighttime. She is more alert and is at baseline today after the changes were made. Will continue the risperidone at bedtime and add another risperidone in the morning as needed. Ativan can be used as needed, low dose, for agitation too. 2. Klebsiella urinary tract infection. Based on sensitivities, she was on Rocephin in the hospital and is being changed to Levaquin until she finishes the course as mentioned above.   3. Hypertension. She is on lisinopril, Norvasc, clonidine patch, and also hydralazine.  4. Elevated troponin, slight bump in troponin, probably demand ischemia. She probably has underlying coronary artery disease given her age, probably atherosclerotic in nature, but she is not a candidate for aggressive intervention and she was not clinically symptomatic and troponin remained stable with no bump in CK or CK-MB. She is on medical management with cardiac medications.  5. Hyponatremia. Sodium was 125 when she came to the hospital. She had a similar admission for psychogenic polydipsia in the past so fluid restriction was done and she was placed on some normal saline for dehydration and her sodium improved and remained steady at 135.   Other than the above-mentioned, her course has been uneventful. Discharge planning was discussed with both daughter-in-law and son over the phone on the day of discharge and they are comfortable with the plan.   DISCHARGE CONDITION: Stable.    DISCHARGE DISPOSITION: To skilled nursing facility after PT recommendation.   TIME SPENT ON DISCHARGE: 45 minutes.   CODE STATUS: DO NOT RESUSCITATE.   ____________________________ Enid Baas, MD rk:drc D: 07/22/2011 12:44:07 ET T: 07/22/2011 13:11:58 ET JOB#: [id number]  cc: Enid Baas, MD, <Dictator>, Dr. Glade Lloyd at [facility] Enid Baas MD ELECTRONICALLY SIGNED 07/22/2011 13:39

## 2014-10-08 NOTE — H&P (Signed)
PATIENT NAME:  Angela Cervantes, Angela Cervantes MR#:  [id number] DATE OF BIRTH:  1921-09-02  DATE OF ADMISSION:  07/18/2011  REFERRING PHYSICIAN: Dr. Sharma CovertNorman  [facility]  PRESENTING COMPLAINT: Was sent over via PCPs office with reports of abnormal labs and also family members report worsening mentation.   HISTORY OF PRESENT ILLNESS: Angela Cervantes is an 81 year old woman with history of dementia, CVA, urinary incontinence, urinary tract infection, hypertension who presents from home with her son and daughter-in-law. They report that they were instructed to come to the Emergency Room given abnormal lab findings at the PCPs office. Her daughter-in-law reports that for the past 1 to 2 weeks that she has noticed some increased change in her mentation with also bowel and urine incontinence which is not usual for patient. She also notices increased water intake. Her daughter-in-law admits that things have worsened with regards to her dementia and she has required increased demand and care from herself and her husband. Patient was admitted back in October 2012 for similar issue with altered mental status and found to have electrolyte disturbance at that time as well.   PAST MEDICAL HISTORY:  1. Admitted 10/12 to 06/01/2011 with encephalopathy thought to be related to hyponatremia and urinary tract infection plus/minus progression of dementia. Her urinary tract infection was Escherichia coli resistant to quinolones and her hyponatremia was thought to be psychogenic polydipsia given normalization with fluid restriction. Her hospitalization was also complicated by accelerated hypertension and elevated cardiac enzymes which was evaluated by cardiology and thought to be demand ischemia.  2. Osteoarthritis with history of knee effusions requiring injections.  3. Dementia with behavioral disturbance.

## 2014-10-08 NOTE — Consult Note (Signed)
Brief Consult Note: Diagnosis: Heme positive stools.   Patient was seen by consultant.   Consult note dictated.   Comments: Heme positive stools in a critically ill female with pneumonia. Normal H and H. No GI complaints and no sign of active bleeding. As hemeoccult test was developed to screen healthy individuals, its utility in hospitalized, sick individuals is unknown as this test is probably of no use in such cases. Apparently, patient [facility] a [facility] and that was unremarkable, according to the family. No further recommendations. Patient can follow with her gastroenterologist after discharge. Will sign off. Please consult on call GI if any signs of active GI bleeding or any other concerns. Thanks.  Electronic Signatures: Lurline DelIftikhar, Admir Candelas (MD)  (Signed 11-Mar-13 19:05)  Authored: Brief Consult Note   Last Updated: 11-Mar-13 19:05 by Lurline DelIftikhar, Tavaria Mackins (MD)

## 2014-10-08 NOTE — Discharge Summary (Signed)
PATIENT NAME:  Angela PartridgeROYE, Cadee MR#:  [id number] DATE OF BIRTH:  [birth year]  DATE OF ADMISSION:  08/24/2011 DATE OF DISCHARGE:  08/28/2011  PRIMARY CARE PHYSICIAN:  Dr. Andrey SpearmanArchinal.  CARDIOLOGY:  Dr. Mariah MillingGollan.  DISCHARGE DIAGNOSES:  1. Metabolic encephalopathy likely due to infection with left lower lobe pneumonia improving clinically, now back to baseline. 2. Left lower lobe pneumonia, improving on Levaquin and Rocephin.  3. Dehydration, improved on intravenous fluids.  4. Acute renal failure likely due to underlying chronic kidney disease, close to baseline. Creatinine on the date of discharge was 1.7.  5. Hyponatremia, resolved with intravenous fluids.  6. Hemoccult stool, on proton pump inhibitor. No further intervention per Dr. Niel HummerIftikhar. No importance of heme-positive stool at this point.  7. Elevated troponin. No acute coronary syndrome, likely due to supply/demand ischemia.   SECONDARY DIAGNOSES:  1. Alzheimer's dementia.  2. Recurrent urinary tract infection.  3. Hypertension.  4. Atherosclerotic cardiovascular disease. 5. Osteoarthritis.  6. Depression.  7. History of cerebrovascular accident.  8. Gout.  9. Gastroesophageal reflux disease.  10. Hyperlipidemia.  11. Osteoarthritis.   CONSULTATIONS:  1. Physical therapy. 2. Speech therapy.  3. Gastroenterology, Dr. Niel HummerIftikhar.  4. Palliative care.   PROCEDURE/RADIOLOGY:  1. Bilateral kidney ultrasound on 03/12 showed no acute changes.  2. CT scan of the head without contrast on 03/10 showed no acute abnormalities.  3. Chest x-ray on 03/10 showed atelectasis versus left lower lobe infiltrate.  4. 2-D echocardiogram on 03/11 showed normal LV systolic function, ejection fraction of more than 55%, impaired LV relaxation.   MAJOR LABORATORY PANEL: Urinalysis on admission was negative. Blood cultures x2 were negative. Urine culture was negative. Hemoccult blood was positive.   HISTORY AND SHORT HOSPITAL COURSE: The patient is an  81 year old female with above-mentioned medical problems who was admitted from skilled nursing facility for worsening lethargy and altered mental status. She was found to be dehydrated with hyponatremia and was started on IV Rocephin and Levaquin for possible left lower lobe pneumonia. Her sodium was 153. She was hydrated with D5W and was slowly improving with her sodium being normal range of 144 on 03/12. Her creatinine was 1.7 which remained about the same which is thought to be due to acute on chronic kidney disease, stage II to III as a new baseline. She was also found to have borderline elevated troponin which was thought to be due to supply/demand ischemia. No myocardial infarction. She was evaluated by Gastroenterology, Dr. Niel HummerIftikhar, due to heme positive stool and he did not find any obvious etiology for same and did not recommend any further intervention. There was no active bleeding and the patient was hemodynamically stable. The patient was evaluated by palliative care and was made DO NOT RESUSCITATE status. She was evaluated by physical therapy along with speech therapy and recommendations were made to go to rehab and speech therapy recommendations were also made for dietary changes. She underwent 2-D echocardiogram with results dictated above. She was slowly improving with IV antibiotics. She early on was being treated for possible urine infection although her urine culture remained negative and her sodium normalized. Her metabolic encephalopathy was slowly improving with treatment of pneumonia and she is close to baseline today and is [facility]   PERTINENT PHYSICAL EXAMINATION:  VITAL SIGNS: On the date of discharge, her vital signs are as follows: Temperature 98.7, heart rate 80 per minute, respirations 20 per minute, blood pressure 134/70. She is saturating 98%  on room air. CARDIOVASCULAR: S1, S2 normal. No murmur, rubs, or gallop. LUNGS: Clear to  auscultation bilaterally. No wheezes, rales, rhonchi, or crepitation. ABDOMEN: Soft, benign. NEUROLOGIC: Nonfocal examination. All other physical examination remained at baseline. She likely has baseline dementia.   DISCHARGE MEDICATIONS:  1. Aspirin 81 mg p.o. daily.  2. Simvastatin 20 mg p.o. at bedtime.  3. Allopurinol 100 mg p.o. daily.  4. Colace 100 mg p.o. b.i.d.  5. Lisinopril 10 mg 2 tablets p.o. daily. 6. Amlodipine 10 mg p.o. daily.  7. Detrol 2 mg p.o. daily.  8. Hydralazine 25 mg 2 tablets p.o. 4 times a day.  9. MiraLAX once daily. 10. Clonidine 0.2 mg transdermal once a week.  11. Prilosec 40 mg p.o. daily.  12. Multivitamin 1 tablet p.o. daily.  13. Lasix 20 mg half tablet p.o. daily. 14. Aricept 5 mg p.o. at bedtime.  15. Risperidone 0.5 mg p.o. every morning as needed.  16. Levaquin 500 mg p.o. daily for two more days.   DISCHARGE DIET: Low sodium, mechanical soft, puree with aspiration precaution, pureed  meat with gravy, thin liquids. Medications in puree, crush if necessary or able. Strict aspiration precaution tray to be up set up at meals. Food preferences also. The patient must sit fully upright with all oral intake, meals/medication, small bites/sips, eat/feed slowly. Moisten food well. Soups on trays.   DISCHARGE ACTIVITY: As tolerated.   DISCHARGE INSTRUCTIONS AND FOLLOW-UP:  1. The patient was instructed to follow-up with her primary care physician, Dr. Andrey Spearman, in 1 to 2 weeks.  2. She will need follow-up with Dr. Mariah Milling in 2 to 3 weeks and will get physical therapy evaluation and management while at the facility.          TOTAL TIME DISCHARGING THIS PATIENT: 55 minutes.   ____________________________ Ellamae Sia. Sherryll Burger, MD vss:ap D: 08/28/2011 14:31:00 ET T: 08/28/2011 14:53:56 ET JOB#: [id number]  cc: Tameia Rafferty S. Sherryll Burger, MD, <Dictator> Ginette A. Andrey Spearman, MD Antonieta Iba, MD Lurline Del, MD Ellamae Sia [facility] MD ELECTRONICALLY SIGNED 08/28/2011  20:07

## 2015-06-17 DEATH — deceased
